# Patient Record
Sex: Female | Born: 1950
Health system: Southern US, Community
[De-identification: ages and names within clinical notes are randomized; demographics above are authoritative.]

## PROBLEM LIST (undated history)

## (undated) DIAGNOSIS — E7800 Pure hypercholesterolemia, unspecified: Secondary | ICD-10-CM

## (undated) DIAGNOSIS — I10 Essential (primary) hypertension: Secondary | ICD-10-CM

## (undated) HISTORY — DX: Essential (primary) hypertension: I10

## (undated) HISTORY — DX: Pure hypercholesterolemia, unspecified: E78.00

---
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
## 2000-07-27 ENCOUNTER — Other Ambulatory Visit: Admission: RE | Admit: 2000-07-27 | Discharge: 2000-07-27 | Payer: Self-pay | Admitting: Gynecology

## 2000-08-06 ENCOUNTER — Encounter: Payer: Self-pay | Admitting: Gynecology

## 2000-08-06 ENCOUNTER — Ambulatory Visit (HOSPITAL_COMMUNITY): Admission: RE | Admit: 2000-08-06 | Discharge: 2000-08-06 | Payer: Self-pay | Admitting: Gynecology

## 2002-08-11 ENCOUNTER — Other Ambulatory Visit: Admission: RE | Admit: 2002-08-11 | Discharge: 2002-08-11 | Payer: Self-pay | Admitting: Gynecology

## 2004-10-16 ENCOUNTER — Ambulatory Visit: Payer: Self-pay

## 2005-07-16 ENCOUNTER — Ambulatory Visit: Payer: Self-pay | Admitting: Family Medicine

## 2005-10-17 ENCOUNTER — Ambulatory Visit: Payer: Self-pay | Admitting: Family Medicine

## 2006-09-14 ENCOUNTER — Ambulatory Visit: Payer: Self-pay | Admitting: Gastroenterology

## 2006-10-21 ENCOUNTER — Ambulatory Visit: Payer: Self-pay | Admitting: Family Medicine

## 2007-10-07 ENCOUNTER — Ambulatory Visit: Payer: Self-pay | Admitting: Family Medicine

## 2008-01-13 DIAGNOSIS — E7800 Pure hypercholesterolemia, unspecified: Secondary | ICD-10-CM

## 2008-01-13 DIAGNOSIS — I10 Essential (primary) hypertension: Secondary | ICD-10-CM

## 2008-05-22 ENCOUNTER — Ambulatory Visit: Payer: Self-pay | Admitting: Family Medicine

## 2008-08-07 DIAGNOSIS — Z86010 Personal history of colonic polyps: Secondary | ICD-10-CM

## 2008-08-15 ENCOUNTER — Ambulatory Visit: Payer: Self-pay | Admitting: Family Medicine

## 2008-12-11 DIAGNOSIS — G4700 Insomnia, unspecified: Secondary | ICD-10-CM

## 2009-04-23 DIAGNOSIS — R739 Hyperglycemia, unspecified: Secondary | ICD-10-CM

## 2009-08-14 ENCOUNTER — Ambulatory Visit: Payer: Self-pay

## 2009-08-14 DIAGNOSIS — E559 Vitamin D deficiency, unspecified: Secondary | ICD-10-CM

## 2011-04-03 ENCOUNTER — Ambulatory Visit: Payer: Self-pay | Admitting: Family Medicine

## 2011-06-26 LAB — HM COLONOSCOPY

## 2011-07-31 ENCOUNTER — Ambulatory Visit: Payer: Self-pay | Admitting: Family Medicine

## 2012-02-05 ENCOUNTER — Ambulatory Visit: Payer: Self-pay | Admitting: Family Medicine

## 2012-05-07 ENCOUNTER — Ambulatory Visit: Payer: Self-pay | Admitting: Family Medicine

## 2014-04-26 ENCOUNTER — Ambulatory Visit: Payer: Self-pay | Admitting: Family Medicine

## 2014-04-26 LAB — HM DEXA SCAN

## 2014-04-26 LAB — HM MAMMOGRAPHY

## 2014-09-28 ENCOUNTER — Other Ambulatory Visit: Payer: Self-pay

## 2014-09-28 DIAGNOSIS — M1711 Unilateral primary osteoarthritis, right knee: Secondary | ICD-10-CM | POA: Insufficient documentation

## 2014-09-28 DIAGNOSIS — R7309 Other abnormal glucose: Secondary | ICD-10-CM | POA: Insufficient documentation

## 2014-09-28 DIAGNOSIS — R7303 Prediabetes: Secondary | ICD-10-CM | POA: Insufficient documentation

## 2014-09-28 DIAGNOSIS — F430 Acute stress reaction: Secondary | ICD-10-CM | POA: Insufficient documentation

## 2014-09-28 DIAGNOSIS — M8580 Other specified disorders of bone density and structure, unspecified site: Secondary | ICD-10-CM | POA: Insufficient documentation

## 2014-09-28 DIAGNOSIS — M25569 Pain in unspecified knee: Secondary | ICD-10-CM | POA: Insufficient documentation

## 2014-09-28 DIAGNOSIS — B999 Unspecified infectious disease: Secondary | ICD-10-CM | POA: Insufficient documentation

## 2014-11-14 ENCOUNTER — Other Ambulatory Visit: Payer: Self-pay | Admitting: Family Medicine

## 2014-12-18 ENCOUNTER — Other Ambulatory Visit: Payer: Self-pay

## 2014-12-18 DIAGNOSIS — I10 Essential (primary) hypertension: Secondary | ICD-10-CM

## 2014-12-18 MED ORDER — METOPROLOL SUCCINATE ER 25 MG PO TB24: 25.0000 mg | ORAL_TABLET | Freq: Every day | ORAL | Status: DC

## 2014-12-18 NOTE — Telephone Encounter (Signed)
Pt contacted office for refill request on the following medications: metoprolol succinate (TOPROL-XL) 25 MG 24 hr tablet to [facility]. Pt stated she has been trying to get this since last week. Thanks TNP

## 2015-01-03 ENCOUNTER — Telehealth: Payer: Self-pay

## 2015-01-03 NOTE — Telephone Encounter (Signed)
Refill request received from [facility] requesting Trazodone HCL 50 mg.

## 2015-01-04 MED ORDER — TRAZODONE HCL 50 MG PO TABS: 25.0000 mg | ORAL_TABLET | Freq: Every evening | ORAL | Status: DC | PRN

## 2015-01-04 NOTE — Telephone Encounter (Signed)
Advised pt as directed. Pt stated that she does not have insurance. She stated that she will call once she gets insurance.  Thanks,

## 2015-01-04 NOTE — Telephone Encounter (Signed)
Need follow up appointment soon. Will send in 1 month refill.

## 2015-03-23 ENCOUNTER — Encounter: Payer: Self-pay | Admitting: Family Medicine

## 2015-04-12 ENCOUNTER — Ambulatory Visit (INDEPENDENT_AMBULATORY_CARE_PROVIDER_SITE_OTHER): Payer: BLUE CROSS/BLUE SHIELD | Admitting: Family Medicine

## 2015-04-12 ENCOUNTER — Encounter: Payer: Self-pay | Admitting: Family Medicine

## 2015-04-12 VITALS — BP 110/80 | HR 64 | Temp 97.8°F | Resp 14 | Ht 60.75 in | Wt 148.6 lb

## 2015-04-12 DIAGNOSIS — G479 Sleep disorder, unspecified: Secondary | ICD-10-CM | POA: Diagnosis not present

## 2015-04-12 DIAGNOSIS — E7800 Pure hypercholesterolemia, unspecified: Secondary | ICD-10-CM

## 2015-04-12 DIAGNOSIS — L309 Dermatitis, unspecified: Secondary | ICD-10-CM

## 2015-04-12 DIAGNOSIS — R7303 Prediabetes: Secondary | ICD-10-CM

## 2015-04-12 DIAGNOSIS — N7689 Other specified inflammation of vagina and vulva: Secondary | ICD-10-CM | POA: Diagnosis not present

## 2015-04-12 DIAGNOSIS — Z0000 Encounter for general adult medical examination without abnormal findings: Secondary | ICD-10-CM | POA: Diagnosis not present

## 2015-04-12 DIAGNOSIS — Z23 Encounter for immunization: Secondary | ICD-10-CM | POA: Diagnosis not present

## 2015-04-12 LAB — POCT URINALYSIS DIPSTICK
BILIRUBIN UA: NEGATIVE
GLUCOSE UA: NEGATIVE
Ketones, UA: NEGATIVE
NITRITE UA: NEGATIVE
Spec Grav, UA: 1.01
UROBILINOGEN UA: 0.2
pH, UA: 7

## 2015-04-12 MED ORDER — TRAZODONE HCL 50 MG PO TABS: 25.0000 mg | ORAL_TABLET | Freq: Every evening | ORAL | Status: DC | PRN

## 2015-04-12 NOTE — Progress Notes (Signed)
Patient ID: Claudia Barnes, female   DOB: June 23, 1950, 65 y.o.   MRN: [id number]       Patient: Claudia Barnes, Female    DOB: 08/21/1950, 65 y.o.   MRN: [id number] Visit Date: 04/12/2015  Today's Provider: Vernie Murders, PA   Chief Complaint  Patient presents with  . Annual Exam   Subjective:    Annual physical exam Claudia Barnes is a 65 y.o. female who presents today for health maintenance and complete physical. She feels fairly well. She reports exercising none outside of the house. She reports she is sleeping poorly (average 3-4 hours per night).  ----------------------------------------------------------------- Mammo: 04/26/2014- Bi-rads Cat. 1 Colonoscopy: 06/26/2011- internal hemorroids Tdap: 08/14/2009 BMD:04/26/2014- osteopenia Pap: 03/20/2014  Review of Systems  Constitutional: Negative.        Crying, irritability   HENT: Negative.   Eyes: Negative.   Respiratory: Negative.   Cardiovascular: Negative.   Gastrointestinal: Positive for constipation.  Endocrine: Negative.   Genitourinary: Negative.        Vaginal itch   Musculoskeletal: Positive for arthralgias.  Skin: Negative.   Allergic/Immunologic: Negative.   Neurological: Negative.   Hematological: Negative.   Psychiatric/Behavioral: Negative.           Social History   Social History  . Marital Status: Divorced    Spouse Name: N/A  . Number of Children: N/A  . Years of Education: N/A   Social History Main Topics  . Smoking status: None  . Smokeless tobacco: None  . Alcohol Use: None  . Drug Use: None  . Sexual Activity: Not Asked   Other Topics Concern  . None   Social History Narrative    Patient Active Problem List   Diagnosis Date Noted  . Acute stress disorder 09/28/2014  . Contagious disease 09/28/2014  . Elevated glycated hemoglobin 09/28/2014  . Arthritis of knee, degenerative 09/28/2014  . Osteopenia 09/28/2014  . Pain in joint, lower leg  09/28/2014  . Borderline diabetes 09/28/2014  . Avitaminosis D 08/14/2009  . Abnormal blood sugar 04/23/2009  . Cannot sleep 12/11/2008  . History of colon polyps 08/07/2008  . Benign essential HTN 01/13/2008  . Hypercholesterolemia without hypertriglyceridemia 01/13/2008    Past Surgical History  Procedure Laterality Date  . Cesarean section  1981  . Appendectomy  1970    Family History        Family Status  Relation Status Death Age  . Father Deceased 52  . Sister Alive   . Sister Alive         Her family history includes COPD in her mother; Congestive Heart Failure in her father; Depression in her mother; Hyperlipidemia in her sister and sister; Hypertension in her sister and sister.    Allergies  Allergen Reactions  . Codeine     Previous Medications   CHOLECALCIFEROL 1000 UNITS CAPSULE    Take 2 capsules by mouth daily.   LOVASTATIN (MEVACOR) 40 MG TABLET    TAKE ONE TABLET AT BEDTIME   METOPROLOL SUCCINATE (TOPROL-XL) 25 MG 24 HR TABLET    Take 1 tablet (25 mg total) by mouth daily.   SENNA (SENOKOT) 8.6 MG TABLET    Take 1 tablet by mouth daily.   TRAZODONE (DESYREL) 50 MG TABLET    Take 0.5-1 tablets (25-50 mg total) by mouth at bedtime as needed for sleep.    Patient Care Team: Margo Common, PA as PCP - General (Physician Assistant)     Objective:  Vitals: BP 110/80 mmHg  Pulse 64  Temp(Src) 97.8 F (36.6 C) (Oral)  Resp 14  Ht 5' 0.75" (1.543 m)  Wt 148 lb 9.6 oz (67.405 kg)  BMI 28.31 kg/m2   Physical Exam  Constitutional: She is oriented to person, place, and time. She appears well-developed and well-nourished.  HENT:  Head: Normocephalic and atraumatic.  Right Ear: External ear normal.  Left Ear: External ear normal.  Nose: Nose normal.  Mouth/Throat: Oropharynx is clear and moist.  Eyes: Conjunctivae and EOM are normal. Pupils are equal, round, and reactive to light. Right eye exhibits no discharge.  Neck: Normal range of motion. Neck  supple. No tracheal deviation present. No thyromegaly present.  Cardiovascular: Normal rate, regular rhythm, normal heart sounds and intact distal pulses.   No murmur heard. Pulmonary/Chest: Effort normal and breath sounds normal. No respiratory distress. She has no wheezes. She has no rales. She exhibits no tenderness.  Abdominal: Soft. She exhibits no distension and no mass. There is no tenderness. There is no rebound and no guarding.  Genitourinary: Vagina normal.  Vulvar pruritic rash with slight white scale and surrounding erythema. No pain or ulcerations. No vaginal discharge.  Musculoskeletal: Normal range of motion. She exhibits no edema.  Some crepitus of the knees (R>L).  Lymphadenopathy:    She has no cervical adenopathy.  Neurological: She is alert and oriented to person, place, and time. She has normal reflexes. No cranial nerve deficit. She exhibits normal muscle tone. Coordination normal.  Skin: Skin is warm and dry. Rash noted.  Pruritic scaly rash in posterior occipital hairline with some thick silvery scale.  Psychiatric: She has a normal mood and affect. Her behavior is normal. Judgment and thought content normal.     Depression Screen Feeling stress with son and two grandchildren living with her every weekend. Some crying spells and not sleeping well. Has not taken Trazodone routinely. It usually helps.    Assessment & Plan:     Routine Health Maintenance and Physical Exam  Exercise Activities and Dietary recommendations Goals    Continue balanced diet and trying to exercise as arthritis of knees allows.      Immunization History  Administered Date(s) Administered  . Tdap 08/14/2009    Health Maintenance  Topic Date Due  . Hepatitis C Screening  Jan 30, 1951  . HIV Screening  08/28/1967  . PAP SMEAR  08/27/1973  . ZOSTAVAX  08/27/2012  . INFLUENZA VACCINE  10/30/2014  . MAMMOGRAM  04/26/2016  . TETANUS/TDAP  08/15/2019  . COLONOSCOPY  06/25/2021       Discussed health benefits of physical activity, and encouraged her to engage in regular exercise appropriate for her age and condition.    -------------------------------------------------------------------- 1. Annual physical exam General health stable. Having arthritis issues in knees (had Synvisc injection in the right knee by Dr. Marry Guan in March 2015). Will up date immunizations today. Will get urinalysis and routine labs. - POCT urinalysis dipstick  2. Need for influenza vaccination - Flu Vaccine QUAD 36+ mos PF IM (Fluarix & Fluzone Quad PF)  3. Borderline diabetes Last Hgb A1C on 08-16-13 was 5.8. Will recheck labs and encouraged to restrict sugar intake and follow low fat diet. May need Metformin if Hgb A1C is higher. - CBC with Differential/Platelet - Hemoglobin A1c  4. Hypercholesterolemia without hypertriglyceridemia Tolerating the Lovastatin 40 mg qd. Encouraged to follow a low fat diet and continue the Lovastatin regularly. Recheck labs and follow up pending reports. - COMPLETE METABOLIC PANEL  WITH GFR - Lipid panel - TSH  5. Sleep disorder Associated with stress disorder due to family issues. Recommend she restart the Trazodone and recheck in 3 months. - traZODone (DESYREL) 50 MG tablet; Take 0.5-1 tablets (25-50 mg total) by mouth at bedtime as needed for sleep.  Dispense: 30 tablet; Refill: 6  6. Vulvar dermatitis Suspect psoriasis with patch on scalp posterior occipital hairline. May use steroid prescribed by dermatologist. Recommend follow up with dermatologist soon.  7. Need for Zostavax administration - Varicella-zoster vaccine subcutaneous

## 2015-04-13 LAB — CBC WITH DIFFERENTIAL/PLATELET
Basophils Absolute: 0 10*3/uL (ref 0.0–0.2)
Basos: 0 %
EOS (ABSOLUTE): 0.1 10*3/uL (ref 0.0–0.4)
EOS: 1 %
HEMATOCRIT: 40.2 % (ref 34.0–46.6)
HEMOGLOBIN: 14 g/dL (ref 11.1–15.9)
IMMATURE GRANS (ABS): 0 10*3/uL (ref 0.0–0.1)
Immature Granulocytes: 0 %
LYMPHS ABS: 1.7 10*3/uL (ref 0.7–3.1)
LYMPHS: 29 %
MCH: 28.7 pg (ref 26.6–33.0)
MCHC: 34.8 g/dL (ref 31.5–35.7)
MCV: 83 fL (ref 79–97)
MONOCYTES: 6 %
Monocytes Absolute: 0.4 10*3/uL (ref 0.1–0.9)
NEUTROS ABS: 3.9 10*3/uL (ref 1.4–7.0)
Neutrophils: 64 %
Platelets: 244 10*3/uL (ref 150–379)
RBC: 4.87 x10E6/uL (ref 3.77–5.28)
RDW: 13.8 % (ref 12.3–15.4)
WBC: 6.1 10*3/uL (ref 3.4–10.8)

## 2015-04-13 LAB — COMPREHENSIVE METABOLIC PANEL
ALK PHOS: 84 IU/L (ref 39–117)
ALT: 20 IU/L (ref 0–32)
AST: 26 IU/L (ref 0–40)
Albumin/Globulin Ratio: 2.2 (ref 1.1–2.5)
Albumin: 4.8 g/dL (ref 3.6–4.8)
BILIRUBIN TOTAL: 0.4 mg/dL (ref 0.0–1.2)
BUN/Creatinine Ratio: 12 (ref 11–26)
BUN: 9 mg/dL (ref 8–27)
CHLORIDE: 97 mmol/L (ref 96–106)
CO2: 26 mmol/L (ref 18–29)
CREATININE: 0.73 mg/dL (ref 0.57–1.00)
Calcium: 9.8 mg/dL (ref 8.7–10.3)
GFR calc Af Amer: 102 mL/min/{1.73_m2} (ref >59)
GFR calc non Af Amer: 89 mL/min/{1.73_m2} (ref >59)
GLOBULIN, TOTAL: 2.2 g/dL (ref 1.5–4.5)
Glucose: 101 mg/dL — ABNORMAL HIGH (ref 65–99)
Potassium: 4.7 mmol/L (ref 3.5–5.2)
Sodium: 140 mmol/L (ref 134–144)
Total Protein: 7 g/dL (ref 6.0–8.5)

## 2015-04-13 LAB — LIPID PANEL
CHOL/HDL RATIO: 3.5 ratio (ref 0.0–4.4)
CHOLESTEROL TOTAL: 195 mg/dL (ref 100–199)
HDL: 55 mg/dL (ref >39)
LDL CALC: 121 mg/dL — AB (ref 0–99)
TRIGLYCERIDES: 97 mg/dL (ref 0–149)
VLDL CHOLESTEROL CAL: 19 mg/dL (ref 5–40)

## 2015-04-13 LAB — HEMOGLOBIN A1C
Est. average glucose Bld gHb Est-mCnc: 120 mg/dL
Hgb A1c MFr Bld: 5.8 % — ABNORMAL HIGH (ref 4.8–5.6)

## 2015-04-13 LAB — TSH: TSH: 1.18 u[IU]/mL (ref 0.450–4.500)

## 2015-04-14 ENCOUNTER — Telehealth: Payer: Self-pay

## 2015-04-14 NOTE — Telephone Encounter (Signed)
Patient advised as directed below.  Thanks,  -Joseline 

## 2015-04-14 NOTE — Telephone Encounter (Signed)
-----   Message from [place], [place] sent at 04/13/2015  5:52 PM EST ----- Blood tests essentially normal except Hgb A1C borderline prediabetic level and LDL cholesterol still a little elevated. Very good HDL and triglyceride levels. Continue low fat diet, lower sugar intake and proceed with present dosages of medications. Recheck in 4 months.

## 2015-05-09 ENCOUNTER — Other Ambulatory Visit: Payer: Self-pay | Admitting: Family Medicine

## 2015-06-08 ENCOUNTER — Other Ambulatory Visit: Payer: Self-pay | Admitting: Family Medicine

## 2015-07-20 ENCOUNTER — Encounter: Payer: Self-pay | Admitting: Family Medicine

## 2015-07-20 ENCOUNTER — Ambulatory Visit (INDEPENDENT_AMBULATORY_CARE_PROVIDER_SITE_OTHER): Payer: BLUE CROSS/BLUE SHIELD | Admitting: Family Medicine

## 2015-07-20 VITALS — BP 108/70 | HR 69 | Temp 98.0°F | Resp 16 | Wt 145.2 lb

## 2015-07-20 DIAGNOSIS — L409 Psoriasis, unspecified: Secondary | ICD-10-CM | POA: Diagnosis not present

## 2015-07-20 DIAGNOSIS — R300 Dysuria: Secondary | ICD-10-CM

## 2015-07-20 LAB — POCT URINALYSIS DIPSTICK
Bilirubin, UA: NEGATIVE
GLUCOSE UA: NEGATIVE
Ketones, UA: NEGATIVE
NITRITE UA: NEGATIVE
Protein, UA: NEGATIVE
Spec Grav, UA: 1.01
UROBILINOGEN UA: 0.2
pH, UA: 7

## 2015-07-20 MED ORDER — NITROFURANTOIN MONOHYD MACRO 100 MG PO CAPS: 100.0000 mg | ORAL_CAPSULE | Freq: Two times a day (BID) | ORAL | Status: DC

## 2015-07-20 NOTE — Progress Notes (Signed)
Patient ID: Claudia Barnes, female   DOB: 01/18/1951, 65 y.o.   MRN: [id number]   Patient: Claudia Barnes Female    DOB: 04/22/1950   65 y.o.   MRN: [id number] Visit Date: 07/20/2015  Today's Provider: Vernie Murders, PA   Chief Complaint  Patient presents with  . Dysuria   Subjective:    Dysuria  This is a new problem. The current episode started yesterday. The problem occurs every urination. The problem has been unchanged. The quality of the pain is described as burning. The pain is at a severity of 5/10. Associated symptoms include frequency and urgency. Associated symptoms comments: incontinence . She has tried increased fluids for the symptoms. The treatment provided no relief.   Patient Active Problem List   Diagnosis Date Noted  . Acute stress disorder 09/28/2014  . Contagious disease 09/28/2014  . Elevated glycated hemoglobin 09/28/2014  . Arthritis of knee, degenerative 09/28/2014  . Osteopenia 09/28/2014  . Pain in joint, lower leg 09/28/2014  . Borderline diabetes 09/28/2014  . Avitaminosis D 08/14/2009  . Abnormal blood sugar 04/23/2009  . Cannot sleep 12/11/2008  . History of colon polyps 08/07/2008  . Benign essential HTN 01/13/2008  . Hypercholesterolemia without hypertriglyceridemia 01/13/2008   History reviewed. No pertinent past medical history.   Past Surgical History  Procedure Laterality Date  . Cesarean section  1981  . Appendectomy  1970   Family History  Problem Relation Age of Onset  . COPD Mother   . Depression Mother   . Congestive Heart Failure Father   . Hyperlipidemia Sister   . Hypertension Sister   . Hyperlipidemia Sister   . Hypertension Sister      Previous Medications   CHOLECALCIFEROL 1000 UNITS CAPSULE    Take 2 capsules by mouth daily.   LOVASTATIN (MEVACOR) 40 MG TABLET    TAKE ONE (1) TABLET AT BEDTIME   METOPROLOL SUCCINATE (TOPROL-XL) 25 MG 24 HR TABLET    TAKE ONE (1) TABLET EACH DAY   MULTIPLE  VITAMIN (MULTIVITAMIN) TABLET    Take 1 tablet by mouth daily.   SENNA (SENOKOT) 8.6 MG TABLET    Take 1 tablet by mouth daily.   TRAZODONE (DESYREL) 50 MG TABLET    Take 0.5-1 tablets (25-50 mg total) by mouth at bedtime as needed for sleep.   Allergies  Allergen Reactions  . Codeine     Review of Systems  Constitutional: Negative.   HENT: Negative.   Eyes: Negative.   Respiratory: Negative.   Cardiovascular: Negative.   Gastrointestinal: Negative.   Endocrine: Negative.   Genitourinary: Positive for dysuria, urgency and frequency.  Musculoskeletal: Negative.   Skin: Negative.   Allergic/Immunologic: Negative.   Neurological: Negative.   Hematological: Negative.   Psychiatric/Behavioral: Negative.     Social History  Substance Use Topics  . Smoking status: Never Smoker   . Smokeless tobacco: Never Used  . Alcohol Use: 0.0 oz/week    0 Standard drinks or equivalent per week     Comment: occasionally drinks wine   Objective:   BP 108/70 mmHg  Pulse 69  Temp(Src) 98 F (36.7 C) (Oral)  Resp 16  Wt 145 lb 3.2 oz (65.862 kg)  Physical Exam  Constitutional: She is oriented to person, place, and time. She appears well-developed and well-nourished. No distress.  HENT:  Head: Normocephalic and atraumatic.  Right Ear: Hearing normal.  Left Ear: Hearing normal.  Nose: Nose normal.  Eyes: Conjunctivae and lids are normal. Right  eye exhibits no discharge. Left eye exhibits no discharge. No scleral icterus.  Neck: Neck supple.  Cardiovascular: Normal rate and regular rhythm.   Pulmonary/Chest: Effort normal and breath sounds normal. No respiratory distress.  Abdominal: Soft. Bowel sounds are normal.  Musculoskeletal: Normal range of motion.  Neurological: She is alert and oriented to person, place, and time.  Skin: Skin is intact. Rash noted. No lesion noted.  Dry silvery scale on rash in occipital hairline. Pruritic.  Psychiatric: She has a normal mood and affect. Her  speech is normal and behavior is normal. Thought content normal.      Assessment & Plan:     1. Dysuria Onset yesterday with burning while urinating. No hematuria. Urinalysis showed 1+ bacteria and 8-10 WBC's per hpf. Will start Macrobid and may use AZO-Standard for discomfort. Increase fluid intake and get C&S. Recheck pending culture report. - POCT urinalysis dipstick - Urine culture - nitrofurantoin, macrocrystal-monohydrate, (MACROBID) 100 MG capsule; Take 1 capsule (100 mg total) by mouth 2 (two) times daily.  Dispense: 14 capsule; Refill: 0  2. Psoriasis Flare in occipital hair line. May use Nizoral or Selsun shampoo with Hydrocortisone or Benadryl prn itching. Recheck prn.

## 2015-07-21 LAB — URINE CULTURE: Organism ID, Bacteria: NO GROWTH

## 2015-07-23 ENCOUNTER — Telehealth: Payer: Self-pay

## 2015-07-23 NOTE — Telephone Encounter (Signed)
-----   Message from Margo Common, [place] sent at 07/23/2015  2:05 AM EDT ----- No significant bacterial growth on culture. Finish the [employer]. If symptoms persist, may need referral to urologist.

## 2015-07-23 NOTE — Telephone Encounter (Signed)
Patient advised as directed below. Patient verbalized understanding.  

## 2015-08-08 ENCOUNTER — Other Ambulatory Visit: Payer: Self-pay | Admitting: Family Medicine

## 2015-08-20 ENCOUNTER — Ambulatory Visit (INDEPENDENT_AMBULATORY_CARE_PROVIDER_SITE_OTHER): Payer: BLUE CROSS/BLUE SHIELD | Admitting: Family Medicine

## 2015-08-20 ENCOUNTER — Encounter: Payer: Self-pay | Admitting: Family Medicine

## 2015-08-20 VITALS — BP 114/66 | HR 66 | Temp 98.0°F | Resp 16 | Wt 148.0 lb

## 2015-08-20 DIAGNOSIS — I10 Essential (primary) hypertension: Secondary | ICD-10-CM | POA: Diagnosis not present

## 2015-08-20 DIAGNOSIS — E7800 Pure hypercholesterolemia, unspecified: Secondary | ICD-10-CM | POA: Diagnosis not present

## 2015-08-20 DIAGNOSIS — G4700 Insomnia, unspecified: Secondary | ICD-10-CM | POA: Diagnosis not present

## 2015-08-20 DIAGNOSIS — R7303 Prediabetes: Secondary | ICD-10-CM | POA: Diagnosis not present

## 2015-08-20 LAB — POCT GLYCOSYLATED HEMOGLOBIN (HGB A1C): Hemoglobin A1C: 5.6

## 2015-08-20 NOTE — Progress Notes (Signed)
Patient ID: Claudia Barnes, female   DOB: 1950-12-16, 65 y.o.   MRN: [id number]       Patient: Claudia Barnes Female    DOB: 12/25/1950   65 y.o.   MRN: [id number] Visit Date: 08/20/2015  Today's Provider: Vernie Murders, PA   Chief Complaint  Patient presents with  . Diabetes  . Hyperlipidemia  . Insomnia  . Hypertension   Subjective:    HPI  Patient is here for 4 months follow up.  Diabetes: Patient does not check her sugars at home, she is not on any medications for this. Lab Results  Component Value Date   HGBA1C 5.8* 04/12/2015   Hyperlipidemia: She is taking Lovastatin daily. Tolerating it well. Lab Results  Component Value Date   CHOL 195 04/12/2015   HDL 55 04/12/2015   LDLCALC 121* 04/12/2015   TRIG 97 04/12/2015   CHOLHDL 3.5 04/12/2015   Insomnia: patient was re started on Trazodone in January. Patient states it has helped her some. She has hard time staying asleep.hear noises in the "old" home at night.  Hypertension: patient does not check her b/p at home. No cardiac symptoms. BP Readings from Last 3 Encounters:  08/20/15 114/66  07/20/15 108/70  04/12/15 110/80   No past medical history on file. Patient Active Problem List   Diagnosis Date Noted  . Acute stress disorder 09/28/2014  . Contagious disease 09/28/2014  . Elevated glycated hemoglobin 09/28/2014  . Arthritis of knee, degenerative 09/28/2014  . Osteopenia 09/28/2014  . Pain in joint, lower leg 09/28/2014  . Borderline diabetes 09/28/2014  . Avitaminosis D 08/14/2009  . Abnormal blood sugar 04/23/2009  . Cannot sleep 12/11/2008  . History of colon polyps 08/07/2008  . Benign essential HTN 01/13/2008  . Hypercholesterolemia without hypertriglyceridemia 01/13/2008   Past Surgical History  Procedure Laterality Date  . Cesarean section  1981  . Appendectomy  1970   Family History  Problem Relation Age of Onset  . COPD Mother   . Depression Mother   .  Congestive Heart Failure Father   . Hyperlipidemia Sister   . Hypertension Sister   . Hyperlipidemia Sister   . Hypertension Sister    Allergies  Allergen Reactions  . Codeine    Previous Medications   ASCORBIC ACID (VITAMIN C) 1000 MG TABLET    Take 1,000 mg by mouth daily.   CHOLECALCIFEROL 1000 UNITS CAPSULE    Take 2 capsules by mouth daily.   LOVASTATIN (MEVACOR) 40 MG TABLET    TAKE ONE (1) TABLET AT BEDTIME   METOPROLOL SUCCINATE (TOPROL-XL) 25 MG 24 HR TABLET    TAKE ONE (1) TABLET EACH DAY   MULTIPLE VITAMIN (MULTIVITAMIN) TABLET    Take 1 tablet by mouth daily.   SENNA (SENOKOT) 8.6 MG TABLET    Take 1 tablet by mouth daily.   TRAZODONE (DESYREL) 50 MG TABLET    Take 0.5-1 tablets (25-50 mg total) by mouth at bedtime as needed for sleep.    Review of Systems  Constitutional: Negative.   Respiratory: Negative.   Cardiovascular: Negative.   Gastrointestinal: Negative.   Musculoskeletal: Positive for arthralgias and gait problem.    Social History  Substance Use Topics  . Smoking status: Never Smoker   . Smokeless tobacco: Never Used  . Alcohol Use: 0.0 oz/week    0 Standard drinks or equivalent per week     Comment: occasionally drinks wine   Objective:   BP 114/66 mmHg  Pulse 66  Temp(Src) 98 F (36.7 C)  Resp 16  Wt 148 lb (67.132 kg) Wt Readings from Last 3 Encounters:  08/20/15 148 lb (67.132 kg)  07/20/15 145 lb 3.2 oz (65.862 kg)  04/12/15 148 lb 9.6 oz (67.405 kg)    Physical Exam  Constitutional: She is oriented to person, place, and time. She appears well-developed and well-nourished. No distress.  HENT:  Head: Normocephalic and atraumatic.  Right Ear: Hearing and external ear normal.  Left Ear: Hearing and external ear normal.  Nose: Nose normal.  Eyes: Conjunctivae and lids are normal. Right eye exhibits no discharge. Left eye exhibits no discharge. No scleral icterus.  Neck: Neck supple.  Cardiovascular: Normal rate and regular rhythm.     Pulmonary/Chest: Effort normal and breath sounds normal. No respiratory distress.  Abdominal: Soft. Bowel sounds are normal.  Musculoskeletal: Normal range of motion.  Crepitus both knees and uses can for support.  Neurological: She is alert and oriented to person, place, and time.  Skin: Skin is intact. No lesion and no rash noted.  Psychiatric: She has a normal mood and affect. Her speech is normal and behavior is normal. Thought content normal.      Assessment & Plan:     1. Borderline diabetes Very good control with diet restrictions. Hgb A1C 5.6 today. Encouraged to proceed with water aerobics to protect arthritis in knees. Recheck in 3-4 months. - POCT HgB A1C  2. Hypercholesterolemia without hypertriglyceridemia Stable and tolerating Lovastatin 40 mg qd without side effects. Continue low fat diet.  3. Cannot sleep Beginning to sleep better now that she has restarted the Trazodone. Depressive symptoms still present. No suicidal ideation. Living alone and worried about her family. Encouraged to increase dosage to a whole 50 mg tablet every night. Recheck in 3-4 months.  4. Benign essential HTN Stable and well controlled. Tolerating Metoprolol succinate 25 mg qd without palpitations, chest pain, edema or dyspnea. Continue present dosage and follow up in 3-4 months.       Vernie Murders, PA  [facility]

## 2015-12-14 ENCOUNTER — Encounter: Payer: Self-pay | Admitting: Family Medicine

## 2015-12-14 ENCOUNTER — Ambulatory Visit (INDEPENDENT_AMBULATORY_CARE_PROVIDER_SITE_OTHER): Payer: BLUE CROSS/BLUE SHIELD | Admitting: Family Medicine

## 2015-12-14 VITALS — BP 108/56 | HR 82 | Temp 97.5°F | Resp 16 | Wt 147.4 lb

## 2015-12-14 DIAGNOSIS — Z23 Encounter for immunization: Secondary | ICD-10-CM

## 2015-12-14 DIAGNOSIS — F439 Reaction to severe stress, unspecified: Secondary | ICD-10-CM

## 2015-12-14 DIAGNOSIS — R002 Palpitations: Secondary | ICD-10-CM | POA: Diagnosis not present

## 2015-12-14 DIAGNOSIS — M79602 Pain in left arm: Secondary | ICD-10-CM

## 2015-12-14 DIAGNOSIS — F430 Acute stress reaction: Secondary | ICD-10-CM

## 2015-12-14 DIAGNOSIS — M79622 Pain in left upper arm: Secondary | ICD-10-CM

## 2015-12-14 NOTE — Progress Notes (Signed)
Patient: Claudia Barnes Female    DOB: 1950-04-06   65 y.o.   MRN: [id number] Visit Date: 12/14/2015  Today's Provider: Vernie Murders, PA   Chief Complaint  Patient presents with  . Arm Pain   Subjective:    Arm Pain   The incident occurred 12 to 24 hours ago. The incident occurred at home. Injury mechanism: reaching in cabinet. The pain is present in the left shoulder. The quality of the pain is described as aching. The pain does not radiate. The patient is experiencing no pain. The pain has been improving since the incident.   No past medical history on file. Patient Active Problem List   Diagnosis Date Noted  . Acute stress disorder 09/28/2014  . Contagious disease 09/28/2014  . Elevated glycated hemoglobin 09/28/2014  . Arthritis of knee, degenerative 09/28/2014  . Osteopenia 09/28/2014  . Pain in joint, lower leg 09/28/2014  . Borderline diabetes 09/28/2014  . Avitaminosis D 08/14/2009  . Abnormal blood sugar 04/23/2009  . Cannot sleep 12/11/2008  . History of colon polyps 08/07/2008  . Benign essential HTN 01/13/2008  . Hypercholesterolemia without hypertriglyceridemia 01/13/2008   Past Surgical History:  Procedure Laterality Date  . APPENDECTOMY  1970  . CESAREAN SECTION  1981   Family History  Problem Relation Age of Onset  . COPD Mother   . Depression Mother   . Congestive Heart Failure Father   . Hyperlipidemia Sister   . Hypertension Sister   . Hyperlipidemia Sister   . Hypertension Sister    Allergies  Allergen Reactions  . Codeine    Previous Medications   ASCORBIC ACID (VITAMIN C) 1000 MG TABLET    Take 1,000 mg by mouth daily.   CHOLECALCIFEROL 1000 UNITS CAPSULE    Take 2 capsules by mouth daily.   LOVASTATIN (MEVACOR) 40 MG TABLET    TAKE ONE (1) TABLET AT BEDTIME   METOPROLOL SUCCINATE (TOPROL-XL) 25 MG 24 HR TABLET    TAKE ONE (1) TABLET EACH DAY   MULTIPLE VITAMIN (MULTIVITAMIN) TABLET    Take 1 tablet by mouth daily.   SENNA  (SENOKOT) 8.6 MG TABLET    Take 1 tablet by mouth daily.   TRAZODONE (DESYREL) 50 MG TABLET    Take 0.5-1 tablets (25-50 mg total) by mouth at bedtime as needed for sleep.   Review of Systems  Constitutional: Negative.   Respiratory: Negative.   Cardiovascular: Negative.   Musculoskeletal: Positive for myalgias.   Social History  Substance Use Topics  . Smoking status: Never Smoker  . Smokeless tobacco: Never Used  . Alcohol use 0.0 oz/week     Comment: occasionally drinks wine   Objective:   BP (!) 108/56 (BP Location: Right Arm, Patient Position: Sitting, Cuff Size: Normal)   Pulse 82   Temp 97.5 F (36.4 C) (Oral)   Resp 16   Wt 147 lb 6.4 oz (66.9 kg)   BMI 28.08 kg/m  BP Readings from Last 3 Encounters:  12/14/15 (!) 108/56  08/20/15 114/66  07/20/15 108/70    Physical Exam  Constitutional: She is oriented to person, place, and time. She appears well-developed and well-nourished. No distress.  HENT:  Head: Normocephalic and atraumatic.  Right Ear: Hearing normal.  Left Ear: Hearing normal.  Nose: Nose normal.  Eyes: Conjunctivae and lids are normal. Right eye exhibits no discharge. Left eye exhibits no discharge. No scleral icterus.  Neck: Neck supple.  Cardiovascular: Normal rate, regular rhythm and normal heart sounds.  Pulmonary/Chest: Effort normal and breath sounds normal. No respiratory distress.  Abdominal: Soft. Bowel sounds are normal.  Musculoskeletal: Normal range of motion.  Minimal soreness in left axillary muscles.  Neurological: She is alert and oriented to person, place, and time.  Skin: Skin is intact. No lesion and no rash noted.  Psychiatric: She has a normal mood and affect. Her speech is normal and behavior is normal. Thought content normal.      Assessment & Plan:     1. Left axillary pain Onset over the past 12-24 hours after trying to reach for something very high overhead. Suspect muscle strain in axilla. No pain or tenderness today.  May use moist heat and recheck prn.  2. Palpitations Denies chest pain or palpitations today. EKG normal with minor changes. Suspected secondary to anxiety and stress reaction from family stress. Proceed with anxiety/depression/insomnia treatment with the Trazodone. - EKG 12-Lead  3. Acute stress disorder Family conflicts and had to make 68 year old son move out of her home. Not sleeping well and some spontaneous crying spells. Has not started the Trazodone to try to help sleep and these symptoms. Will go ahead and take the Trazodone 25-50 mg hs. Recheck in a month to assess progress.  4. Need for influenza vaccination - Flu Vaccine QUAD 36+ mos PF IM (Fluarix & Fluzone Quad PF)

## 2016-01-02 ENCOUNTER — Other Ambulatory Visit: Payer: Self-pay | Admitting: Family Medicine

## 2016-04-02 ENCOUNTER — Other Ambulatory Visit: Payer: Self-pay | Admitting: Family Medicine

## 2016-04-14 ENCOUNTER — Ambulatory Visit (INDEPENDENT_AMBULATORY_CARE_PROVIDER_SITE_OTHER): Payer: BLUE CROSS/BLUE SHIELD | Admitting: Family Medicine

## 2016-04-14 ENCOUNTER — Encounter: Payer: Self-pay | Admitting: Family Medicine

## 2016-04-14 VITALS — BP 122/76 | HR 63 | Temp 98.0°F | Resp 14 | Ht 60.75 in | Wt 147.2 lb

## 2016-04-14 DIAGNOSIS — E7800 Pure hypercholesterolemia, unspecified: Secondary | ICD-10-CM | POA: Diagnosis not present

## 2016-04-14 DIAGNOSIS — Z114 Encounter for screening for human immunodeficiency virus [HIV]: Secondary | ICD-10-CM

## 2016-04-14 DIAGNOSIS — Z1231 Encounter for screening mammogram for malignant neoplasm of breast: Secondary | ICD-10-CM | POA: Diagnosis not present

## 2016-04-14 DIAGNOSIS — L409 Psoriasis, unspecified: Secondary | ICD-10-CM | POA: Insufficient documentation

## 2016-04-14 DIAGNOSIS — Z0000 Encounter for general adult medical examination without abnormal findings: Secondary | ICD-10-CM

## 2016-04-14 DIAGNOSIS — M170 Bilateral primary osteoarthritis of knee: Secondary | ICD-10-CM

## 2016-04-14 DIAGNOSIS — I10 Essential (primary) hypertension: Secondary | ICD-10-CM | POA: Diagnosis not present

## 2016-04-14 DIAGNOSIS — Z1239 Encounter for other screening for malignant neoplasm of breast: Secondary | ICD-10-CM

## 2016-04-14 DIAGNOSIS — M8589 Other specified disorders of bone density and structure, multiple sites: Secondary | ICD-10-CM | POA: Diagnosis not present

## 2016-04-14 DIAGNOSIS — Z1159 Encounter for screening for other viral diseases: Secondary | ICD-10-CM | POA: Diagnosis not present

## 2016-04-14 NOTE — Progress Notes (Signed)
Patient: Claudia Barnes, Female    DOB: Aug 09, 1950, 66 y.o.   MRN: [id number] Visit Date: 04/14/2016  Today's Provider: Vernie Murders, PA   Chief Complaint  Patient presents with  . Annual Exam   Subjective:    Annual physical exam Claudia Barnes is a 66 y.o. female who presents today for health maintenance and complete physical. She feels well. She reports exercising none. She reports she is sleeping poorly.  -----------------------------------------------------------------   Review of Systems  Constitutional: Positive for chills.  HENT: Negative.   Eyes: Negative.   Respiratory: Negative.   Cardiovascular: Negative.   Gastrointestinal: Positive for constipation.  Endocrine: Negative.   Genitourinary: Negative.   Musculoskeletal: Positive for arthralgias.  Skin: Negative.   Allergic/Immunologic: Negative.   Neurological: Negative.   Hematological: Negative.   Psychiatric/Behavioral: Positive for sleep disturbance.    Social History      She  reports that she has never smoked. She has never used smokeless tobacco. She reports that she drinks alcohol. She reports that she does not use drugs.       Social History   Social History  . Marital status: Divorced    Spouse name: N/A  . Number of children: N/A  . Years of education: N/A   Social History Main Topics  . Smoking status: Never Smoker  . Smokeless tobacco: Never Used  . Alcohol use 0.0 oz/week     Comment: occasionally drinks wine  . Drug use: No  . Sexual activity: Not Asked   Other Topics Concern  . None   Social History Narrative  . None    No past medical history on file.   Patient Active Problem List   Diagnosis Date Noted  . Acute stress disorder 09/28/2014  . Contagious disease 09/28/2014  . Elevated glycated hemoglobin 09/28/2014  . Arthritis of knee, degenerative 09/28/2014  . Osteopenia 09/28/2014  . Pain in joint, lower leg 09/28/2014  .  Borderline diabetes 09/28/2014  . Avitaminosis D 08/14/2009  . Abnormal blood sugar 04/23/2009  . Cannot sleep 12/11/2008  . History of colon polyps 08/07/2008  . Benign essential HTN 01/13/2008  . Hypercholesterolemia without hypertriglyceridemia 01/13/2008    Past Surgical History:  Procedure Laterality Date  . APPENDECTOMY  1970  . CESAREAN SECTION  1981    Family History        Family Status  Relation Status  . Father Deceased at age 38  . Sister Alive  . Sister Alive        Her family history includes COPD in her mother; Congestive Heart Failure in her father; Depression in her mother; Hyperlipidemia in her sister and sister; Hypertension in her sister and sister.     Allergies  Allergen Reactions  . Codeine      Current Outpatient Prescriptions:  .  Ascorbic Acid (VITAMIN C) 1000 MG tablet, Take 1,000 mg by mouth daily., Disp: , Rfl:  .  CALCIUM PO, Take by mouth., Disp: , Rfl:  .  Cholecalciferol 1000 UNITS capsule, Take 2 capsules by mouth daily., Disp: , Rfl:  .  lovastatin (MEVACOR) 40 MG tablet, TAKE ONE TABLET AT BEDTIME, Disp: 30 tablet, Rfl: 3 .  metoprolol succinate (TOPROL-XL) 25 MG 24 hr tablet, TAKE ONE (1) TABLET EACH DAY, Disp: 30 tablet, Rfl: 12 .  Multiple Vitamin (MULTIVITAMIN) tablet, Take 1 tablet by mouth daily., Disp: , Rfl:  .  senna (SENOKOT) 8.6 MG tablet, Take 1 tablet by mouth  daily., Disp: , Rfl:  .  traZODone (DESYREL) 50 MG tablet, Take 0.5-1 tablets (25-50 mg total) by mouth at bedtime as needed for sleep., Disp: 30 tablet, Rfl: 6   Patient Care Team: Margo Common, PA as PCP - General (Physician Assistant)      Objective:   Vitals: BP 122/76 (BP Location: Right Arm, Patient Position: Sitting, Cuff Size: Normal)   Pulse 63   Temp 98 F (36.7 C) (Oral)   Resp 14   Ht 5' 0.75" (1.543 m)   Wt 147 lb 3.2 oz (66.8 kg)   SpO2 99%   BMI 28.04 kg/m    Physical Exam  Constitutional: She is oriented to person, place, and time.  She appears well-developed and well-nourished.  HENT:  Head: Normocephalic and atraumatic.  Right Ear: External ear normal.  Left Ear: External ear normal.  Nose: Nose normal.  Mouth/Throat: Oropharynx is clear and moist.  Eyes: Conjunctivae and EOM are normal. Pupils are equal, round, and reactive to light. Right eye exhibits no discharge.  Neck: Normal range of motion. Neck supple. No tracheal deviation present. No thyromegaly present.  Cardiovascular: Normal rate, regular rhythm, normal heart sounds and intact distal pulses.   No murmur heard. Pulmonary/Chest: Effort normal and breath sounds normal. No respiratory distress. She has no wheezes. She has no rales. She exhibits no tenderness.  Abdominal: Soft. She exhibits no distension and no mass. There is no tenderness. There is no rebound and no guarding.  Genitourinary:  Genitourinary Comments: Pink to tan vulvar rash with thick scale on the right. Normal breast exam without lymphadenopathy, masses or nipple discharge.  Musculoskeletal: Normal range of motion. She exhibits no edema or tenderness.  Crepitus and some enlargement of knee joints (R>L).  Lymphadenopathy:    She has no cervical adenopathy.  Neurological: She is alert and oriented to person, place, and time. She has normal reflexes. No cranial nerve deficit. She exhibits normal muscle tone. Coordination normal.  Skin: Skin is warm and dry. Rash noted. No erythema.  Psoriasis posterior hairline. A few scattered brown to flesh colored seborrheic keratoses.  Psychiatric: She has a normal mood and affect. Her behavior is normal. Judgment and thought content normal.     Depression Screen No flowsheet data found.    Assessment & Plan:     Routine Health Maintenance and Physical Exam  Exercise Activities and Dietary recommendations Goals    Unable to exercise much due to significant arthritis of knees. Recommend low fat diet. May need to consider water aerobics.       Immunization History  Administered Date(s) Administered  . Influenza Split 03/20/2010, 04/02/2011, 02/05/2012  . Influenza,inj,Quad PF,36+ Mos 12/30/2013, 04/12/2015, 12/14/2015  . Td 03/07/2003  . Tdap 08/14/2009  . Zoster 04/12/2015    Health Maintenance  Topic Date Due  . Hepatitis C Screening  06/27/1950  . HIV Screening  08/28/1967  . MAMMOGRAM  04/26/2016  . PAP SMEAR  03/20/2017  . TETANUS/TDAP  08/15/2019  . COLONOSCOPY  06/25/2021  . INFLUENZA VACCINE  Completed  . ZOSTAVAX  Completed     Discussed health benefits of physical activity, and encouraged her to engage in regular exercise appropriate for her age and condition.    -------------------------------------------------------------------- 1. Annual physical exam General health stable. Immunizations up to date. Next colonoscopy due in 2021. Given anticipatory guidance. Continues Trazodone 50 mg 1/2 tablet as needed for sleep disturbance. If she take a whole tablet, she will be drowsy the next day.  Will get routine labs.  2. Benign essential HTN Tolerating Metoprolol without side effects. BP well controlled today. Will check labs and recheck pending reports. - CBC with Differential/Platelet - Comprehensive metabolic panel  3. Osteopenia of multiple sites Last BMD in 2016 showed osteopenia of femur and lumbar spine. Still taking Calcium and Vitamin-D supplements. Will recheck BMD this year to assess progress. - DG Bone Density  4. Primary osteoarthritis of both knees Intermittent pains and crepitus of knees (R>L). Has had orthopedic evaluation in the past with cortisone injection. Rarely using NSAID now. Walks with a cane.  5. Hypercholesterolemia without hypertriglyceridemia Continue low fat diet and exercise as arthritis allows. Recheck labs and follow pending reports. - Comprehensive metabolic panel - Lipid panel - TSH  6. Psoriasis Posterior scalp and vulvar psoriasis. Essentially unchanged. May  continue dandruff shampoo and steroid as prescribed by dermatologist.  7. Breast cancer screening No masses, dimpling or nipple discharge. No local lymphadenopathy. Schedule screening mammograms. - MM Digital Screening  8. Need for hepatitis C screening test - Hepatitis C antibody  9. Screening for HIV (human immunodeficiency virus) - HIV antibody    Vernie Murders, PA  [facility]

## 2016-04-14 NOTE — Patient Instructions (Signed)

## 2016-04-15 ENCOUNTER — Telehealth: Payer: Self-pay

## 2016-04-15 LAB — LIPID PANEL
Chol/HDL Ratio: 3.1 ratio units (ref 0.0–4.4)
Cholesterol, Total: 175 mg/dL (ref 100–199)
HDL: 57 mg/dL (ref >39)
LDL CALC: 96 mg/dL (ref 0–99)
Triglycerides: 111 mg/dL (ref 0–149)
VLDL CHOLESTEROL CAL: 22 mg/dL (ref 5–40)

## 2016-04-15 LAB — CBC WITH DIFFERENTIAL/PLATELET
BASOS ABS: 0 10*3/uL (ref 0.0–0.2)
Basos: 0 %
EOS (ABSOLUTE): 0.1 10*3/uL (ref 0.0–0.4)
Eos: 1 %
Hematocrit: 42.2 % (ref 34.0–46.6)
Hemoglobin: 14.6 g/dL (ref 11.1–15.9)
IMMATURE GRANS (ABS): 0 10*3/uL (ref 0.0–0.1)
IMMATURE GRANULOCYTES: 0 %
LYMPHS: 25 %
Lymphocytes Absolute: 1.6 10*3/uL (ref 0.7–3.1)
MCH: 28.9 pg (ref 26.6–33.0)
MCHC: 34.6 g/dL (ref 31.5–35.7)
MCV: 83 fL (ref 79–97)
Monocytes Absolute: 0.3 10*3/uL (ref 0.1–0.9)
Monocytes: 5 %
NEUTROS PCT: 69 %
Neutrophils Absolute: 4.5 10*3/uL (ref 1.4–7.0)
PLATELETS: 249 10*3/uL (ref 150–379)
RBC: 5.06 x10E6/uL (ref 3.77–5.28)
RDW: 13.7 % (ref 12.3–15.4)
WBC: 6.5 10*3/uL (ref 3.4–10.8)

## 2016-04-15 LAB — COMPREHENSIVE METABOLIC PANEL
ALT: 16 IU/L (ref 0–32)
AST: 23 IU/L (ref 0–40)
Albumin/Globulin Ratio: 2 (ref 1.2–2.2)
Albumin: 4.8 g/dL (ref 3.6–4.8)
Alkaline Phosphatase: 78 IU/L (ref 39–117)
BUN/Creatinine Ratio: 13 (ref 12–28)
BUN: 8 mg/dL (ref 8–27)
Bilirubin Total: 0.6 mg/dL (ref 0.0–1.2)
CALCIUM: 10 mg/dL (ref 8.7–10.3)
CO2: 26 mmol/L (ref 18–29)
Chloride: 99 mmol/L (ref 96–106)
Creatinine, Ser: 0.64 mg/dL (ref 0.57–1.00)
GFR, EST AFRICAN AMERICAN: 110 mL/min/{1.73_m2} (ref >59)
GFR, EST NON AFRICAN AMERICAN: 95 mL/min/{1.73_m2} (ref >59)
GLUCOSE: 103 mg/dL — AB (ref 65–99)
Globulin, Total: 2.4 g/dL (ref 1.5–4.5)
Potassium: 4.4 mmol/L (ref 3.5–5.2)
Sodium: 142 mmol/L (ref 134–144)
TOTAL PROTEIN: 7.2 g/dL (ref 6.0–8.5)

## 2016-04-15 LAB — TSH: TSH: 0.87 u[IU]/mL (ref 0.450–4.500)

## 2016-04-15 LAB — HEPATITIS C ANTIBODY

## 2016-04-15 LAB — HIV ANTIBODY (ROUTINE TESTING W REFLEX): HIV SCREEN 4TH GENERATION: NONREACTIVE

## 2016-04-15 NOTE — Telephone Encounter (Signed)
-----   Message from Margo Common, [place] sent at 04/15/2016  3:29 PM EST ----- All blood tests normal. Recheck annually at physical.

## 2016-04-15 NOTE — Telephone Encounter (Signed)
LMTCB

## 2016-04-21 NOTE — Telephone Encounter (Signed)
Left patient a voicemail advising her that all labs were normal and if she had any further questions to call us back.

## 2016-06-03 ENCOUNTER — Telehealth: Payer: Self-pay

## 2016-06-03 ENCOUNTER — Ambulatory Visit
Admission: RE | Admit: 2016-06-03 | Discharge: 2016-06-03 | Disposition: A | Discharge status: Home / Self Care | Payer: BLUE CROSS/BLUE SHIELD | Source: Ambulatory Visit | Attending: Family Medicine | Admitting: Family Medicine

## 2016-06-03 ENCOUNTER — Ambulatory Visit: Admission: RE | Admit: 2016-06-03 | Payer: Self-pay | Source: Ambulatory Visit

## 2016-06-03 DIAGNOSIS — Z1231 Encounter for screening mammogram for malignant neoplasm of breast: Secondary | ICD-10-CM | POA: Insufficient documentation

## 2016-06-03 NOTE — Telephone Encounter (Signed)
Patient advised as below.  

## 2016-06-03 NOTE — Telephone Encounter (Signed)
-----   Message from Margo Common, [place] sent at 06/03/2016  1:43 PM EST ----- Normal mammograms without sign of cancer. Repeat annually.

## 2016-07-28 ENCOUNTER — Other Ambulatory Visit: Payer: Self-pay | Admitting: Family Medicine

## 2016-07-31 ENCOUNTER — Encounter: Payer: Self-pay | Admitting: Family Medicine

## 2016-11-10 ENCOUNTER — Other Ambulatory Visit: Payer: Self-pay | Admitting: Family Medicine

## 2016-11-10 DIAGNOSIS — G479 Sleep disorder, unspecified: Secondary | ICD-10-CM

## 2016-11-13 ENCOUNTER — Encounter: Payer: Self-pay | Admitting: Family Medicine

## 2016-11-13 ENCOUNTER — Ambulatory Visit (INDEPENDENT_AMBULATORY_CARE_PROVIDER_SITE_OTHER): Payer: BLUE CROSS/BLUE SHIELD | Admitting: Family Medicine

## 2016-11-13 VITALS — BP 104/62 | HR 64 | Temp 97.5°F | Resp 16 | Wt 150.0 lb

## 2016-11-13 DIAGNOSIS — L821 Other seborrheic keratosis: Secondary | ICD-10-CM

## 2016-11-13 DIAGNOSIS — M546 Pain in thoracic spine: Secondary | ICD-10-CM

## 2016-11-13 NOTE — Patient Instructions (Signed)

## 2016-11-13 NOTE — Progress Notes (Signed)
Patient: Claudia Barnes Female    DOB: [birth year]   66 y.o.   MRN: [id number] Visit Date: 11/13/2016  Today's Provider: Lavon Paganini, MD   Chief Complaint  Patient presents with  . Back Pain   Subjective:    Back Pain  This is a new problem. The current episode started in the past 7 days. The problem occurs daily. The problem has been gradually improving (pt reports this is better today because she "hasn't moved much today") since onset. The pain is present in the thoracic spine. Quality: sharp. The pain does not radiate. The pain is at a severity of 7/10. The symptoms are aggravated by twisting. Pertinent negatives include no abdominal pain, bladder incontinence, bowel incontinence, chest pain, dysuria, fever, headaches, leg pain, numbness, pelvic pain, tingling, weakness or weight loss. Risk factors include poor posture. She has tried nothing for the symptoms.    Pt also has some skin issues, including a skin lesion on her left calf that is darkening and "skin tags" - which she describes as white spots of dry skin that appear from time to time.  Spot on left calf has not enlarged.  She does not wear sunscreen regularly.    Allergies  Allergen Reactions  . Codeine Nausea Only     Current Outpatient Prescriptions:  .  Ascorbic Acid (VITAMIN C) 1000 MG tablet, Take 1,000 mg by mouth daily., Disp: , Rfl:  .  lovastatin (MEVACOR) 40 MG tablet, TAKE ONE TABLET AT BEDTIME, Disp: 30 tablet, Rfl: 11 .  metoprolol succinate (TOPROL-XL) 25 MG 24 hr tablet, TAKE ONE (1) TABLET EACH DAY, Disp: 30 tablet, Rfl: 12 .  Multiple Vitamin (MULTIVITAMIN) tablet, Take 1 tablet by mouth daily., Disp: , Rfl:  .  senna (SENOKOT) 8.6 MG tablet, Take 1 tablet by mouth daily., Disp: , Rfl:  .  traZODone (DESYREL) 50 MG tablet, TAKE 1/2 TO 1 TABLET AT BEDTIME AS NEEDED FOR SLEEP, Disp: 30 tablet, Rfl: 6 .  CALCIUM PO, Take by mouth., Disp: , Rfl:  .  Cholecalciferol 1000 UNITS capsule,  Take 2 capsules by mouth daily., Disp: , Rfl:   Review of Systems  Constitutional: Negative for fever and weight loss.  Cardiovascular: Negative for chest pain.  Gastrointestinal: Negative for abdominal pain and bowel incontinence.  Genitourinary: Negative for bladder incontinence, dysuria and pelvic pain.  Musculoskeletal: Positive for back pain.  Skin: Positive for color change (of lesion).  Neurological: Negative for tingling, weakness, numbness and headaches.    Social History  Substance Use Topics  . Smoking status: Never Smoker  . Smokeless tobacco: Never Used  . Alcohol use 0.0 oz/week     Comment: occasionally drinks wine   Objective:   BP 104/62 (BP Location: Left Arm, Patient Position: Sitting, Cuff Size: Normal)   Pulse 64   Temp (!) 97.5 F (36.4 C) (Oral)   Resp 16   Wt 150 lb (68 kg)   BMI 28.58 kg/m  Vitals:   11/13/16 1544  BP: 104/62  Pulse: 64  Resp: 16  Temp: (!) 97.5 F (36.4 C)  TempSrc: Oral  Weight: 150 lb (68 kg)     Physical Exam  Constitutional: She is oriented to person, place, and time. She appears well-developed and well-nourished. No distress.  HENT:  Head: Normocephalic and atraumatic.  Right Ear: External ear normal.  Left Ear: External ear normal.  Eyes: Conjunctivae are normal. No scleral icterus.  Neck: Neck supple.  Cardiovascular: Normal rate,  regular rhythm and normal heart sounds.   No murmur heard. Pulmonary/Chest: Effort normal and breath sounds normal. No respiratory distress. She has no wheezes. She has no rales.  Musculoskeletal: She exhibits no edema.  Back: No TTP over midline or paraspinal musculature. Negative SLR b/l. Sensation to light touch and strength intact in LEs.  Neurological: She is alert and oriented to person, place, and time.  Skin: Skin is warm and dry.  Small seb keratosis on L calf - raised, dry, without irregularities.  Psychiatric: She has a normal mood and affect. Her behavior is normal.    Vitals reviewed.       Assessment & Plan:         1. Acute bilateral thoracic back pain - No red flag symptoms or neurologic deficits - likely muscle strain from lifting and bending - advised conservative measures with tylenol, ice/heat - given back exercises to strengthen and prevent recurrence - return precautions discussed  2. Seborrheic keratosis - reassured patient that this is a benign skin lesion - continue to monitor  The entirety of the information documented in the History of Present Illness, Review of Systems and Physical Exam were personally obtained by me. Portions of this information were initially documented by Raquel Sarna Ratchford, CMA and reviewed by me for thoroughness and accuracy.    Lavon Paganini, MD  [facility]

## 2017-01-22 ENCOUNTER — Ambulatory Visit (INDEPENDENT_AMBULATORY_CARE_PROVIDER_SITE_OTHER): Payer: BLUE CROSS/BLUE SHIELD

## 2017-01-22 ENCOUNTER — Telehealth: Payer: Self-pay | Admitting: Family Medicine

## 2017-01-22 DIAGNOSIS — Z23 Encounter for immunization: Secondary | ICD-10-CM

## 2017-01-22 NOTE — Telephone Encounter (Signed)
Pt wants referral to Dr. Marry Guan for knee pain.  Hurting pretty bad in the back of her knees

## 2017-01-23 NOTE — Telephone Encounter (Signed)
She is already a patient of Dr. Marry Guan (he gave her Synvisc injections to the right knee). She should only need to call his office and schedule the appointment.

## 2017-01-23 NOTE — Telephone Encounter (Signed)
LMTCB

## 2017-01-26 NOTE — Telephone Encounter (Signed)
Patient advised.

## 2017-01-30 ENCOUNTER — Other Ambulatory Visit: Payer: Self-pay | Admitting: Family Medicine

## 2017-03-26 ENCOUNTER — Ambulatory Visit: Payer: BLUE CROSS/BLUE SHIELD | Admitting: Obstetrics and Gynecology

## 2017-03-26 ENCOUNTER — Encounter: Payer: Self-pay | Admitting: Obstetrics and Gynecology

## 2017-03-26 VITALS — BP 128/88 | Ht 60.0 in | Wt 150.0 lb

## 2017-03-26 DIAGNOSIS — Z124 Encounter for screening for malignant neoplasm of cervix: Secondary | ICD-10-CM

## 2017-03-26 DIAGNOSIS — L280 Lichen simplex chronicus: Secondary | ICD-10-CM | POA: Diagnosis not present

## 2017-03-26 MED ORDER — HYDROXYZINE HCL 25 MG PO TABS: 25.0000 mg | ORAL_TABLET | Freq: Every day | ORAL | 12 refills | Status: AC

## 2017-03-26 MED ORDER — CLOBETASOL PROPIONATE 0.05 % EX OINT: TOPICAL_OINTMENT | CUTANEOUS | 12 refills | Status: DC

## 2017-03-26 NOTE — Progress Notes (Signed)
Patient ID: Claudia Barnes, female   DOB: 1951-01-06, 66 y.o.   MRN: [id number]  Reason for Consult: Vaginal Itching   Referred by Margo Common, PA  Subjective:      Chief Complaint: Vulvar itching  HPI:  Claudia Barnes is a 66 y.o. female. Patient here with vaginal itching. She has had itching for 3 years. Itching primarily at nigt. She had had biopsies before, but unsure of results. Has not tried oral medications. Has had prescription creams before, but uncertain of results. Denies abnormal vaginal odor. Denies vaginal discharge. Denies   Past Medical History:  Diagnosis Date  . Hypercholesteremia   . Hypertension    Family History  Problem Relation Age of Onset  . Congestive Heart Failure Father   . Hyperlipidemia Sister   . Hypertension Sister   . Hyperlipidemia Sister   . Hypertension Sister   . COPD Mother   . Depression Mother    Past Surgical History:  Procedure Laterality Date  . APPENDECTOMY  1970  . CESAREAN SECTION  1981    Short Social History:  Social History   Tobacco Use  . Smoking status: Never Smoker  . Smokeless tobacco: Never Used  Substance Use Topics  . Alcohol use: Yes    Alcohol/week: 0.0 oz    Comment: occasionally drinks wine    Allergies  Allergen Reactions  . Codeine Nausea Only    Current Outpatient Medications  Medication Sig Dispense Refill  . Ascorbic Acid (VITAMIN C) 1000 MG tablet Take 1,000 mg by mouth daily.    Marland Kitchen CALCIUM PO Take by mouth.    . lovastatin (MEVACOR) 40 MG tablet TAKE ONE TABLET AT BEDTIME 30 tablet 11  . metoprolol succinate (TOPROL-XL) 25 MG 24 hr tablet TAKE 1 TABLET BY MOUTH ONCE A DAY 30 tablet 12  . Multiple Vitamin (MULTIVITAMIN) tablet Take 1 tablet by mouth daily.    . traZODone (DESYREL) 50 MG tablet TAKE 1/2 TO 1 TABLET AT BEDTIME AS NEEDED FOR SLEEP 30 tablet 6  . Cholecalciferol 1000 UNITS capsule Take 2 capsules by mouth daily.    . clobetasol ointment  (TEMOVATE) 0.05 % Apply to affected area every night for 12 weeks, then two to three nights a week after that as needed. 30 g 12  . hydrOXYzine (ATARAX/VISTARIL) 25 MG tablet Take 1 tablet (25 mg total) by mouth at bedtime. 30 tablet 12  . senna (SENOKOT) 8.6 MG tablet Take 1 tablet by mouth daily.     No current facility-administered medications for this visit.     Review of Systems  Constitutional: Negative for chills, fatigue, fever and unexpected weight change.  HENT: Negative for trouble swallowing.  Eyes: Negative for loss of vision.  Respiratory: Negative for cough, shortness of breath and wheezing.  Cardiovascular: Negative for chest pain, leg swelling, palpitations and syncope.  GI: Negative for abdominal pain, blood in stool, diarrhea, nausea and vomiting.  GU: Negative for difficulty urinating, dysuria, frequency and hematuria.  Musculoskeletal: Negative for back pain, leg pain and joint pain.  Skin: Negative for rash.  Neurological: Negative for dizziness, headaches, light-headedness, numbness and seizures.  Psychiatric: Negative for behavioral problem, confusion, depressed mood and sleep disturbance.        Objective:  Objective   Vitals:   03/26/17 0809  BP: 128/88  Weight: 150 lb (68 kg)  Height: 5' (1.524 m)   Body mass index is 29.29 kg/m.  Physical Exam  Constitutional: She is oriented to  person, place, and time. She appears well-developed and well-nourished.  HENT:  Head: Normocephalic and atraumatic.  Neck: No thyromegaly present.  Cardiovascular: Normal rate.  Pulmonary/Chest: Breath sounds normal.  Abdominal: Soft. She exhibits no distension and no mass. There is no tenderness. There is no rebound and no guarding.  Genitourinary: Vagina normal and uterus normal.  Genitourinary Comments: Patient has red inflammation on both labia majora and extending to anus.  No induration. Some small  No signs of prolaps  No vaginal discharge or odor No single  lesion noted. No papules or plaques.  No adnexal masses. Uterus midline and normal size.   Neurological: She is alert and oriented to person, place, and time.  Skin: Skin is warm and dry.  Psychiatric: She has a normal mood and affect. Her behavior is normal. Judgment and thought content normal.  Nursing note and vitals reviewed.    Assessment/Plan:     Has a bathtub but says it is dirty and she doesn't want to sit in it. Recommended showering to moisten bottom as part of soak and seal twice a day.  Will prescribe nighttime oral antihistamine and topical corticosteroid ointment. If not improvement consider steroid injections.  Pap smear and Nuswab sent.   Homero Fellers MD [facility] 03/26/17 10:25 AM

## 2017-03-28 LAB — PAPIG, CTNGTV, HPV, RFX 16/18
Chlamydia, Nuc. Acid Amp: NEGATIVE
Gonococcus, Nuc. Acid Amp: NEGATIVE
HPV, high-risk: NEGATIVE
PAP Smear Comment: 0
Trich vag by NAA: NEGATIVE

## 2017-03-29 LAB — NUSWAB BV AND CANDIDA, NAA
CANDIDA ALBICANS, NAA: NEGATIVE
CANDIDA GLABRATA, NAA: NEGATIVE

## 2017-04-27 ENCOUNTER — Encounter: Payer: BLUE CROSS/BLUE SHIELD | Admitting: Family Medicine

## 2017-04-28 ENCOUNTER — Encounter: Payer: BLUE CROSS/BLUE SHIELD | Admitting: Family Medicine

## 2017-05-07 ENCOUNTER — Ambulatory Visit (INDEPENDENT_AMBULATORY_CARE_PROVIDER_SITE_OTHER): Payer: BLUE CROSS/BLUE SHIELD | Admitting: Family Medicine

## 2017-05-07 ENCOUNTER — Encounter: Payer: Self-pay | Admitting: Family Medicine

## 2017-05-07 VITALS — BP 124/72 | HR 59 | Temp 98.2°F | Wt 154.0 lb

## 2017-05-07 DIAGNOSIS — Z13820 Encounter for screening for osteoporosis: Secondary | ICD-10-CM

## 2017-05-07 DIAGNOSIS — I10 Essential (primary) hypertension: Secondary | ICD-10-CM

## 2017-05-07 DIAGNOSIS — E7800 Pure hypercholesterolemia, unspecified: Secondary | ICD-10-CM

## 2017-05-07 DIAGNOSIS — Z0000 Encounter for general adult medical examination without abnormal findings: Secondary | ICD-10-CM

## 2017-05-07 DIAGNOSIS — M1711 Unilateral primary osteoarthritis, right knee: Secondary | ICD-10-CM

## 2017-05-07 NOTE — Progress Notes (Signed)
Patient: Claudia Barnes, Female    DOB: Mar 24, 1951, 67 y.o.   MRN: [id number] Visit Date: 05/07/2017  Today's Provider: Vernie Murders, PA   Chief Complaint  Patient presents with  . Annual Exam   Subjective:    Annual physical exam Claudia Barnes is a 67 y.o. female who presents today for health maintenance and complete physical. She feels fairly well. She reports exercising none. She reports she is sleeping poorly.  -----------------------------------------------------------------   Review of Systems  Constitutional: Negative.   HENT: Positive for postnasal drip.   Eyes: Negative.   Respiratory: Negative.   Cardiovascular: Negative.   Gastrointestinal: Negative.   Endocrine: Negative.   Genitourinary: Positive for frequency.  Musculoskeletal: Positive for back pain.       Knee pain   Skin: Negative.   Allergic/Immunologic: Negative.   Neurological: Negative.   Hematological: Negative.   Psychiatric/Behavioral: Negative.     Social History      She  reports that  has never smoked. she has never used smokeless tobacco. She reports that she drinks alcohol. She reports that she does not use drugs.       Social History   Socioeconomic History  . Marital status: Divorced    Spouse name: None  . Number of children: None  . Years of education: None  . Highest education level: None  Social Needs  . Financial resource strain: None  . Food insecurity - worry: None  . Food insecurity - inability: None  . Transportation needs - medical: None  . Transportation needs - non-medical: None  Occupational History  . None  Tobacco Use  . Smoking status: Never Smoker  . Smokeless tobacco: Never Used  Substance and Sexual Activity  . Alcohol use: Yes    Alcohol/week: 0.0 oz    Comment: occasionally drinks wine  . Drug use: No  . Sexual activity: None  Other Topics Concern  . None  Social History Narrative  . None   Past Medical History:    Diagnosis Date  . Hypercholesteremia   . Hypertension    Patient Active Problem List   Diagnosis Date Noted  . Lichen simplex chronicus 03/26/2017  . Psoriasis 04/14/2016  . Acute stress disorder 09/28/2014  . Contagious disease 09/28/2014  . Elevated glycated hemoglobin 09/28/2014  . Primary osteoarthritis of right knee 09/28/2014  . Osteopenia 09/28/2014  . Pain in joint, lower leg 09/28/2014  . Borderline diabetes 09/28/2014  . Avitaminosis D 08/14/2009  . Abnormal blood sugar 04/23/2009  . Cannot sleep 12/11/2008  . History of colon polyps 08/07/2008  . Benign essential HTN 01/13/2008  . Hypercholesterolemia without hypertriglyceridemia 01/13/2008    Past Surgical History:  Procedure Laterality Date  . APPENDECTOMY  1970  . [place]    Family History        Family Status  Relation Name Status  . Father  Deceased at age 31  . Sister 1 Alive  . Sister 2 Alive  . Mother  (Not Specified)        Her family history includes COPD in her mother; Congestive Heart Failure in her father; Depression in her mother; Hyperlipidemia in her sister and sister; Hypertension in her sister and sister.     Allergies  Allergen Reactions  . Codeine Nausea Only    Current Outpatient Medications:  .  Ascorbic Acid (VITAMIN C) 1000 MG tablet, Take 1,000 mg by mouth daily., Disp: , Rfl:  .  CALCIUM PO, Take by mouth., Disp: , Rfl:  .  Cholecalciferol 1000 UNITS capsule, Take 2 capsules by mouth daily., Disp: , Rfl:  .  clobetasol ointment (TEMOVATE) 0.05 %, Apply to affected area every night for 12 weeks, then two to three nights a week after that as needed., Disp: 30 g, Rfl: 12 .  hydrOXYzine (ATARAX/VISTARIL) 25 MG tablet, Take 1 tablet (25 mg total) by mouth at bedtime., Disp: 30 tablet, Rfl: 12 .  lovastatin (MEVACOR) 40 MG tablet, TAKE ONE TABLET AT BEDTIME, Disp: 30 tablet, Rfl: 11 .  metoprolol succinate (TOPROL-XL) 25 MG 24 hr tablet, TAKE 1 TABLET BY MOUTH ONCE A  DAY, Disp: 30 tablet, Rfl: 12 .  Multiple Vitamin (MULTIVITAMIN) tablet, Take 1 tablet by mouth daily., Disp: , Rfl:  .  senna (SENOKOT) 8.6 MG tablet, Take 1 tablet by mouth daily., Disp: , Rfl:  .  traZODone (DESYREL) 50 MG tablet, TAKE 1/2 TO 1 TABLET AT BEDTIME AS NEEDED FOR SLEEP, Disp: 30 tablet, Rfl: 6   Patient Care Team: Makayleigh Poliquin, Vickki Muff, PA as PCP - General (Physician Assistant)      Objective:   Vitals: BP 124/72 (BP Location: Right Arm, Patient Position: Sitting, Cuff Size: Normal)   Pulse (!) 59   Temp 98.2 F (36.8 C) (Oral)   Wt 154 lb (69.9 kg)   SpO2 98%   BMI 30.08 kg/m    Physical Exam  Constitutional: She is oriented to person, place, and time. She appears well-developed and well-nourished.  HENT:  Head: Normocephalic and atraumatic.  Right Ear: External ear normal.  Left Ear: External ear normal.  Nose: Nose normal.  Mouth/Throat: Oropharynx is clear and moist.  Eyes: Conjunctivae and EOM are normal. Pupils are equal, round, and reactive to light. Right eye exhibits no discharge.  Neck: Normal range of motion. Neck supple. No tracheal deviation present. No thyromegaly present.  Cardiovascular: Normal rate, regular rhythm, normal heart sounds and intact distal pulses.  No murmur heard. Pulmonary/Chest: Effort normal and breath sounds normal. No respiratory distress. She has no wheezes. She has no rales. She exhibits no tenderness.  Abdominal: Soft. Bowel sounds are normal. She exhibits no distension and no mass. There is no tenderness. There is no rebound and no guarding.  Genitourinary:  Genitourinary Comments: Accomplished exam and PAP by GYN 03-26-17.  Musculoskeletal: Normal range of motion. She exhibits tenderness. She exhibits no edema.  Crepitus and tenderness in right knee with history of osteoarthritis. Heberden's nodes DIP joints right index and middle fingers.  Lymphadenopathy:    She has no cervical adenopathy.  Neurological: She is alert and  oriented to person, place, and time. She has normal reflexes. No cranial nerve deficit. She exhibits normal muscle tone. Coordination normal.  Skin: Skin is warm and dry. No rash noted. No erythema.  Psychiatric: She has a normal mood and affect. Her behavior is normal. Judgment and thought content normal.    Depression Screen PHQ 2/9 Scores 05/07/2017  PHQ - 2 Score 0  PHQ- 9 Score 5    Assessment & Plan:     Routine Health Maintenance and Physical Exam  Exercise Activities and Dietary recommendations Goals    Not very active. Recommend trying to use stationary bike for exercise.       Immunization History  Administered Date(s) Administered  . Influenza Split 03/20/2010, 04/02/2011, 02/05/2012  . Influenza,inj,Quad PF,6+ Mos 12/30/2013, 04/12/2015, 12/14/2015, 01/22/2017  . Td 03/07/2003  . Tdap 08/14/2009  . Zoster 04/12/2015  Health Maintenance  Topic Date Due  . MAMMOGRAM  06/04/2018  . TETANUS/TDAP  08/15/2019  . PAP SMEAR  03/26/2020  . COLONOSCOPY  06/25/2021  . INFLUENZA VACCINE  Completed  . Hepatitis C Screening  Completed  . HIV Screening  Completed     Discussed health benefits of physical activity, and encouraged her to engage in regular exercise appropriate for her age and condition.    -------------------------------------------------------------------- 1. Annual physical exam Fair general health other than arthritis pains. Had GYN exam and PAP 03-26-17 by Dr. Gilman Schmidt with normal report. Recheck labs. Immunizations up to date. Given anticipatory counseling.  2. Benign essential HTN Stable and well controlled with Metoprolol Succinate 25 mg qd. No chest pains, palpitations or edema. Recheck routine labs. - CBC with Differential/Platelet - Comprehensive metabolic panel - TSH  3. Primary osteoarthritis of right knee Right knee pain with crepitus and history of past x-rays showing degenerative changes and spurs. May use Aleve or Advil prn. Recheck if  no better or worsening. May need orthopedic referral.  4. Hypercholesterolemia without hypertriglyceridemia Tolerating Lovastatin 40 mg qd and trying to work on low fat diet. Recheck labs. - Comprehensive metabolic panel - Lipid panel - TSH  5. Screening for osteoporosis - DG Bone Density   Vernie Murders, PA  [facility]

## 2017-05-08 ENCOUNTER — Telehealth: Payer: Self-pay | Admitting: *Deleted

## 2017-05-08 ENCOUNTER — Other Ambulatory Visit: Payer: Self-pay | Admitting: Family Medicine

## 2017-05-08 LAB — CBC WITH DIFFERENTIAL/PLATELET
Basophils Absolute: 0 10*3/uL (ref 0.0–0.2)
Basos: 0 %
EOS (ABSOLUTE): 0.1 10*3/uL (ref 0.0–0.4)
EOS: 1 %
HEMATOCRIT: 39.8 % (ref 34.0–46.6)
HEMOGLOBIN: 13.5 g/dL (ref 11.1–15.9)
Immature Grans (Abs): 0 10*3/uL (ref 0.0–0.1)
Immature Granulocytes: 0 %
LYMPHS ABS: 1.4 10*3/uL (ref 0.7–3.1)
Lymphs: 24 %
MCH: 29 pg (ref 26.6–33.0)
MCHC: 33.9 g/dL (ref 31.5–35.7)
MCV: 85 fL (ref 79–97)
MONOCYTES: 5 %
MONOS ABS: 0.3 10*3/uL (ref 0.1–0.9)
NEUTROS ABS: 4.2 10*3/uL (ref 1.4–7.0)
Neutrophils: 70 %
Platelets: 227 10*3/uL (ref 150–379)
RBC: 4.66 x10E6/uL (ref 3.77–5.28)
RDW: 14.3 % (ref 12.3–15.4)
WBC: 6 10*3/uL (ref 3.4–10.8)

## 2017-05-08 LAB — COMPREHENSIVE METABOLIC PANEL
A/G RATIO: 2 (ref 1.2–2.2)
ALBUMIN: 4.8 g/dL (ref 3.6–4.8)
ALK PHOS: 79 IU/L (ref 39–117)
ALT: 22 IU/L (ref 0–32)
AST: 27 IU/L (ref 0–40)
BILIRUBIN TOTAL: 0.7 mg/dL (ref 0.0–1.2)
BUN / CREAT RATIO: 9 — AB (ref 12–28)
BUN: 6 mg/dL — ABNORMAL LOW (ref 8–27)
CHLORIDE: 99 mmol/L (ref 96–106)
CO2: 25 mmol/L (ref 20–29)
Calcium: 9.6 mg/dL (ref 8.7–10.3)
Creatinine, Ser: 0.67 mg/dL (ref 0.57–1.00)
GFR calc Af Amer: 107 mL/min/{1.73_m2} (ref >59)
GFR calc non Af Amer: 93 mL/min/{1.73_m2} (ref >59)
GLOBULIN, TOTAL: 2.4 g/dL (ref 1.5–4.5)
Glucose: 98 mg/dL (ref 65–99)
POTASSIUM: 3.9 mmol/L (ref 3.5–5.2)
SODIUM: 142 mmol/L (ref 134–144)
Total Protein: 7.2 g/dL (ref 6.0–8.5)

## 2017-05-08 LAB — LIPID PANEL
CHOLESTEROL TOTAL: 167 mg/dL (ref 100–199)
Chol/HDL Ratio: 3 ratio (ref 0.0–4.4)
HDL: 55 mg/dL (ref >39)
LDL CALC: 95 mg/dL (ref 0–99)
Triglycerides: 84 mg/dL (ref 0–149)
VLDL Cholesterol Cal: 17 mg/dL (ref 5–40)

## 2017-05-08 LAB — TSH: TSH: 0.727 u[IU]/mL (ref 0.450–4.500)

## 2017-05-08 NOTE — Telephone Encounter (Signed)
Patient was notified of results. Expressed understanding.  

## 2017-05-08 NOTE — Telephone Encounter (Signed)
Recommend  Kegel exercises to strengthen pelvic floor muscles. If this progresses, probably will need evaluation by urologist to be sure a bladder suspension procedure is not needed.

## 2017-05-08 NOTE — Telephone Encounter (Signed)
-----   Message from Margo Common, [place] sent at 05/08/2017 10:04 AM EST ----- All blood tests in very good shape. Awaiting bone density test results. If Aleve or Advil does not help the arthritis, may need referral to an orthopedist for an injection.

## 2017-05-08 NOTE — Telephone Encounter (Signed)
Patient stated she forgot to mention at her ov that she has been having bladder leakage when coughing or sneezing.

## 2017-05-08 NOTE — Telephone Encounter (Signed)
No answer. Will try again later. 

## 2017-05-08 NOTE — Telephone Encounter (Signed)
Unable to reach pt and unable to leave message.

## 2017-05-12 NOTE — Telephone Encounter (Signed)
Called pt back and notified

## 2017-06-08 ENCOUNTER — Ambulatory Visit
Admission: RE | Admit: 2017-06-08 | Discharge: 2017-06-08 | Disposition: A | Discharge status: Home / Self Care | Payer: BLUE CROSS/BLUE SHIELD | Source: Ambulatory Visit | Attending: Family Medicine | Admitting: Family Medicine

## 2017-06-08 DIAGNOSIS — Z13820 Encounter for screening for osteoporosis: Secondary | ICD-10-CM | POA: Insufficient documentation

## 2017-06-08 DIAGNOSIS — M8588 Other specified disorders of bone density and structure, other site: Secondary | ICD-10-CM | POA: Diagnosis not present

## 2017-06-24 ENCOUNTER — Other Ambulatory Visit: Payer: Self-pay | Admitting: Family Medicine

## 2017-06-24 DIAGNOSIS — G479 Sleep disorder, unspecified: Secondary | ICD-10-CM

## 2017-07-29 ENCOUNTER — Other Ambulatory Visit: Payer: Self-pay | Admitting: Family Medicine

## 2017-11-24 ENCOUNTER — Other Ambulatory Visit: Payer: Self-pay | Admitting: *Deleted

## 2017-11-24 NOTE — Patient Outreach (Signed)
[facility]) Care Management  11/24/2017  Claudia Barnes [birth year] [phone]   3rd attempt made to call placed to introduce [facility] care management services and perform telephone screening.  No answer.  Unable to leave a message, voice mail full.  Will send outreach letter and [facility] information packet in mail.  If call back received, will perform screening at that time.  Valente David, [place], MSN [place] [phone]

## 2018-01-21 ENCOUNTER — Encounter: Payer: Self-pay | Admitting: Family Medicine

## 2018-01-21 ENCOUNTER — Ambulatory Visit (INDEPENDENT_AMBULATORY_CARE_PROVIDER_SITE_OTHER): Payer: PPO | Admitting: Family Medicine

## 2018-01-21 ENCOUNTER — Other Ambulatory Visit: Payer: Self-pay | Admitting: Family Medicine

## 2018-01-21 VITALS — BP 120/80 | HR 63 | Temp 97.9°F | Resp 16 | Wt 152.6 lb

## 2018-01-21 DIAGNOSIS — Z23 Encounter for immunization: Secondary | ICD-10-CM

## 2018-01-21 DIAGNOSIS — B372 Candidiasis of skin and nail: Secondary | ICD-10-CM | POA: Diagnosis not present

## 2018-01-21 DIAGNOSIS — G479 Sleep disorder, unspecified: Secondary | ICD-10-CM

## 2018-01-21 MED ORDER — NYSTATIN 100000 UNIT/GM EX OINT: 1.0000 "application " | TOPICAL_OINTMENT | Freq: Two times a day (BID) | CUTANEOUS | 2 refills | Status: DC

## 2018-01-21 MED ORDER — TRAZODONE HCL 50 MG PO TABS: ORAL_TABLET | ORAL | 5 refills | Status: DC

## 2018-01-21 NOTE — Telephone Encounter (Signed)
[facility] faxed refill request for the following medications:  traZODone (DESYREL) 50 MG tablet   Qty: 30  Last dispensed: 12/24/2017  Please advise.

## 2018-01-21 NOTE — Progress Notes (Signed)
Patient: Claudia Barnes Female    DOB: 1951/01/17   67 y.o.   MRN: [id number] Visit Date: 01/21/2018  Today's Provider: Lavon Paganini, MD   Chief Complaint  Patient presents with  . Rash   Subjective:    Rash  This is a new problem. The current episode started in the past 7 days. The problem has been waxing and waning since onset. The affected locations include the abdomen. The rash is characterized by itchiness and redness. She was exposed to nothing. Pertinent negatives include no fever or sore throat. Past treatments include antibiotic cream (Clobetasol 0.05% ointment). The treatment provided mild relief.      Allergies  Allergen Reactions  . Codeine Nausea Only     Current Outpatient Medications:  .  Ascorbic Acid (VITAMIN C) 1000 MG tablet, Take 1,000 mg by mouth daily., Disp: , Rfl:  .  CALCIUM PO, Take by mouth., Disp: , Rfl:  .  Cholecalciferol 1000 UNITS capsule, Take 2 capsules by mouth daily., Disp: , Rfl:  .  clobetasol ointment (TEMOVATE) 0.05 %, Apply to affected area every night for 12 weeks, then two to three nights a week after that as needed., Disp: 30 g, Rfl: 12 .  hydrOXYzine (ATARAX/VISTARIL) 25 MG tablet, Take 1 tablet (25 mg total) by mouth at bedtime., Disp: 30 tablet, Rfl: 12 .  lovastatin (MEVACOR) 40 MG tablet, TAKE ONE TABLET AT BEDTIME, Disp: 30 tablet, Rfl: 10 .  metoprolol succinate (TOPROL-XL) 25 MG 24 hr tablet, TAKE 1 TABLET BY MOUTH ONCE A DAY, Disp: 30 tablet, Rfl: 12 .  senna (SENOKOT) 8.6 MG tablet, Take 1 tablet by mouth daily., Disp: , Rfl:  .  traZODone (DESYREL) 50 MG tablet, TAKE ONE-HALF TO 1 TABLETS AT BEDTIME AS NEEDED FOR SLEEP, Disp: 30 tablet, Rfl: 5 .  Multiple Vitamin (MULTIVITAMIN) tablet, Take 1 tablet by mouth daily., Disp: , Rfl:   Review of Systems  Constitutional: Negative.  Negative for fever.  HENT: Negative.  Negative for sore throat.   Respiratory: Negative.   Cardiovascular: Negative.     Gastrointestinal: Negative.   Genitourinary: Negative.   Skin: Positive for color change and rash.  Neurological: Negative.     Social History   Tobacco Use  . Smoking status: Never Smoker  . Smokeless tobacco: Never Used  Substance Use Topics  . Alcohol use: Yes    Alcohol/week: 0.0 standard drinks    Comment: occasionally drinks wine   Objective:   BP 120/80 (BP Location: Left Arm, Patient Position: Sitting, Cuff Size: Normal)   Pulse 63   Temp 97.9 F (36.6 C) (Oral)   Resp 16   Wt 152 lb 9.6 oz (69.2 kg)   SpO2 97%   BMI 29.80 kg/m  Vitals:   01/21/18 0816  BP: 120/80  Pulse: 63  Resp: 16  Temp: 97.9 F (36.6 C)  TempSrc: Oral  SpO2: 97%  Weight: 152 lb 9.6 oz (69.2 kg)     Physical Exam  Constitutional: She is oriented to person, place, and time. She appears well-developed and well-nourished. No distress.  HENT:  Head: Normocephalic and atraumatic.  Eyes: Conjunctivae are normal. No scleral icterus.  Neck: Neck supple. No thyromegaly present.  Cardiovascular: Normal rate, regular rhythm, normal heart sounds and intact distal pulses.  No murmur heard. Pulmonary/Chest: Effort normal and breath sounds normal. No respiratory distress. She has no wheezes. She has no rales.  Abdominal: Soft. She exhibits no distension. There is  no tenderness.  Musculoskeletal: She exhibits no edema.  Lymphadenopathy:    She has no cervical adenopathy.  Neurological: She is alert and oriented to person, place, and time.  Skin: Skin is warm and dry. Rash (R inguinal crease and below pannus with erythematous rash with satellite lesions and excoriations) noted.  Psychiatric: She has a normal mood and affect. Her behavior is normal.  Vitals reviewed.       Assessment & Plan:   1. Candidal intertrigo -Recurrent problem -Treat with Nystatin ointment twice daily until resolution -No signs of secondary bacterial infection -Discussed return precautions -Discussed precautions  for keeping the area clean and dry  2. Need for influenza vaccination - Flu vaccine HIGH DOSE PF  3. Need for pneumococcal vaccine - Pneumococcal conjugate vaccine 13-valent IM    Meds ordered this encounter  Medications  . nystatin ointment (MYCOSTATIN)    Sig: Apply 1 application topically 2 (two) times daily.    Dispense:  60 g    Refill:  2     Return in about 4 months (around 05/24/2018) for Welcome to Medicare (after 05/07/17).   The entirety of the information documented in the History of Present Illness, Review of Systems and Physical Exam were personally obtained by me. Portions of this information were initially documented by Joseline Rosas and [name], [place] and reviewed by me for thoroughness and accuracy.    Virginia Crews, MD, MPH [facility] 01/21/2018 5:08 PM

## 2018-01-21 NOTE — Patient Instructions (Addendum)
The CDC recommends two doses of Shingrix (the shingles vaccine) separated by 2 to 6 months for adults age 65 years and older. I recommend checking with your insurance plan regarding coverage for this vaccine.   Intertrigo Intertrigo is skin irritation or inflammation (dermatitis) that occurs when folds of skin rub together. The irritation can cause a rash and make skin raw and itchy. This condition most commonly occurs in the skin folds of these areas:  Toes.  Armpits.  Groin.  Belly.  Breasts.  Buttocks.  Intertrigo is not passed from person to person (is not contagious). What are the causes? This condition is caused by heat, moisture, friction, and lack of air circulation. The condition can be made worse by:  Sweat.  Bacteria or a fungus, such as yeast.  What increases the risk? This condition is more likely to occur if you have moisture in your skin folds. It is also more likely to develop in people who:  Have diabetes.  Are overweight.  Are on bed rest.  Live in a warm and moist climate.  Wear splints, braces, or other medical devices.  Are not able to control their bowels or bladder (have incontinence).  What are the signs or symptoms? Symptoms of this condition include:  A pink or red skin rash.  Brown patches on the skin.  Raw or scaly skin.  Itchiness.  A burning feeling.  Bleeding.  Leaking fluid.  A bad smell.  How is this diagnosed? This condition is diagnosed with a medical history and physical exam. You may also have a skin swab to test for bacteria or a fungus, such as yeast. How is this treated? Treatment may include:  Cleaning and drying your skin.  An oral antibiotic medicine or antibiotic skin cream for a bacterial infection.  Antifungal cream or pills for an infection that was caused by a fungus, such as yeast.  Steroid ointment to relieve itchiness and irritation.  Follow these instructions at home:  Keep the affected  area clean and dry.  Do not scratch your skin.  Stay in a cool environment as much as possible. Use an air conditioner or fan, if available.  Apply over-the-counter and prescription medicines only as told by your health care provider.  If you were prescribed an antibiotic medicine, use it as told by your health care provider. Do not stop using the antibiotic even if your condition improves.  Keep all follow-up visits as told by your health care provider. This is important. How is this prevented?  Maintain a healthy weight.  Take care of your feet, especially if you have diabetes. Foot care includes: ? Wearing shoes that fit well. ? Keeping your feet dry. ? Wearing clean, breathable socks.  Protect the skin around your groin and buttocks, especially if you have incontinence. Skin protection includes: ? Following a regular cleaning routine. ? Using moisturizers and skin protectants. ? Changing protection pads frequently.  Do not wear tight clothes. Wear clothes that are loose and absorbent. Wear clothes that are made of cotton.  Wear a bra that gives good support, if needed.  Shower and dry yourself thoroughly after activity. Use a hair dryer on a cool setting to dry between skin folds, especially after you bathe.  If you have diabetes, keep your blood sugar under control. Contact a health care provider if:  Your symptoms do not improve with treatment.  Your symptoms get worse or they spread.  You notice increased redness and warmth.  You  have a fever. This information is not intended to replace advice given to you by your health care provider. Make sure you discuss any questions you have with your health care provider. Document Released: 03/17/2005 Document Revised: 08/23/2015 Document Reviewed: 09/18/2014 Elsevier Interactive Patient Education  2018 [employer].

## 2018-01-28 ENCOUNTER — Other Ambulatory Visit: Payer: Self-pay | Admitting: Family Medicine

## 2018-01-28 DIAGNOSIS — G479 Sleep disorder, unspecified: Secondary | ICD-10-CM

## 2018-02-24 ENCOUNTER — Other Ambulatory Visit: Payer: Self-pay | Admitting: Family Medicine

## 2018-02-24 NOTE — Telephone Encounter (Signed)
[facility] faxed refill request for the following medications:  metoprolol succinate (TOPROL-XL) 25 MG 24 hr tablet  Qty: 30  Date written: 01/30/2017  Last dispensed:  01/28/2018  Please advise.

## 2018-02-26 MED ORDER — METOPROLOL SUCCINATE ER 25 MG PO TB24: 25.0000 mg | ORAL_TABLET | Freq: Every day | ORAL | 12 refills | Status: DC

## 2018-04-26 ENCOUNTER — Encounter: Payer: Self-pay | Admitting: Family Medicine

## 2018-04-26 ENCOUNTER — Ambulatory Visit (INDEPENDENT_AMBULATORY_CARE_PROVIDER_SITE_OTHER): Payer: PPO | Admitting: Family Medicine

## 2018-04-26 VITALS — BP 130/79 | HR 69 | Temp 97.5°F | Wt 153.8 lb

## 2018-04-26 DIAGNOSIS — L280 Lichen simplex chronicus: Secondary | ICD-10-CM

## 2018-04-26 DIAGNOSIS — Z1239 Encounter for other screening for malignant neoplasm of breast: Secondary | ICD-10-CM | POA: Diagnosis not present

## 2018-04-26 DIAGNOSIS — Z0000 Encounter for general adult medical examination without abnormal findings: Secondary | ICD-10-CM

## 2018-04-26 MED ORDER — CLOBETASOL PROPIONATE 0.05 % EX OINT: TOPICAL_OINTMENT | CUTANEOUS | 12 refills | Status: AC

## 2018-04-26 NOTE — Patient Instructions (Signed)
Preventive Care 42 Years and Older, Female Preventive care refers to lifestyle choices and visits with your health care provider that can promote health and wellness. What does preventive care include?  A yearly physical exam. This is also called an annual well check.  Dental exams once or twice a year.  Routine eye exams. Ask your health care provider how often you should have your eyes checked.  Personal lifestyle choices, including: ? Daily care of your teeth and gums. ? Regular physical activity. ? Eating a healthy diet. ? Avoiding tobacco and drug use. ? Limiting alcohol use. ? Practicing safe sex. ? Taking low-dose aspirin every day. ? Taking vitamin and mineral supplements as recommended by your health care provider. What happens during an annual well check? The services and screenings done by your health care provider during your annual well check will depend on your age, overall health, lifestyle risk factors, and family history of disease. Counseling Your health care provider may ask you questions about your:  Alcohol use.  Tobacco use.  Drug use.  Emotional well-being.  Home and relationship well-being.  Sexual activity.  Eating habits.  History of falls.  Memory and ability to understand (cognition).  Work and work Statistician.  Reproductive health.  Screening You may have the following tests or measurements:  Height, weight, and BMI.  Blood pressure.  Lipid and cholesterol levels. These may be checked every 5 years, or more frequently if you are over 30 years old.  Skin check.  Lung cancer screening. You may have this screening every year starting at age 27 if you have a 30-pack-year history of smoking and currently smoke or have quit within the past 15 years.  Colorectal cancer screening. All adults should have this screening starting at age 33 and continuing until age 46. You will have tests every 1-10 years, depending on your results and the  type of screening test. People at increased risk should start screening at an earlier age. Screening tests may include: ? Guaiac-based fecal occult blood testing. ? Fecal immunochemical test (FIT). ? Stool DNA test. ? Virtual colonoscopy. ? Sigmoidoscopy. During this test, a flexible tube with a tiny camera (sigmoidoscope) is used to examine your rectum and lower colon. The sigmoidoscope is inserted through your anus into your rectum and lower colon. ? Colonoscopy. During this test, a long, thin, flexible tube with a tiny camera (colonoscope) is used to examine your entire colon and rectum.  Hepatitis C blood test.  Hepatitis B blood test.  Sexually transmitted disease (STD) testing.  Diabetes screening. This is done by checking your blood sugar (glucose) after you have not eaten for a while (fasting). You may have this done every 1-3 years.  Bone density scan. This is done to screen for osteoporosis. You may have this done starting at age 37.  Mammogram. This may be done every 1-2 years. Talk to your health care provider about how often you should have regular mammograms. Talk with your health care provider about your test results, treatment options, and if necessary, the need for more tests. Vaccines Your health care provider may recommend certain vaccines, such as:  Influenza vaccine. This is recommended every year.  Tetanus, diphtheria, and acellular pertussis (Tdap, Td) vaccine. You may need a Td booster every 10 years.  Varicella vaccine. You may need this if you have not been vaccinated.  Zoster vaccine. You may need this after age 38.  Measles, mumps, and rubella (MMR) vaccine. You may need at least  one dose of MMR if you were born in 1957 or later. You may also need a second dose.  Pneumococcal 13-valent conjugate (PCV13) vaccine. One dose is recommended after age 24.  Pneumococcal polysaccharide (PPSV23) vaccine. One dose is recommended after age 24.  Meningococcal  vaccine. You may need this if you have certain conditions.  Hepatitis A vaccine. You may need this if you have certain conditions or if you travel or work in places where you may be exposed to hepatitis A.  Hepatitis B vaccine. You may need this if you have certain conditions or if you travel or work in places where you may be exposed to hepatitis B.  Haemophilus influenzae type b (Hib) vaccine. You may need this if you have certain conditions. Talk to your health care provider about which screenings and vaccines you need and how often you need them. This information is not intended to replace advice given to you by your health care provider. Make sure you discuss any questions you have with your health care provider. Document Released: 04/13/2015 Document Revised: 05/07/2017 Document Reviewed: 01/16/2015 Elsevier Interactive Patient Education  2019 [employer].

## 2018-04-26 NOTE — Progress Notes (Signed)
Patient: Claudia Barnes, Female    DOB: 1950/12/01, 68 y.o.   MRN: [id number] Visit Date: 04/26/2018  Today's Provider: Lavon Paganini, MD   Chief Complaint  Patient presents with  . Annual Exam   Subjective:    First annual wellness visit Claudia Barnes is a 68 y.o. female who presents today for her First Annual Wellness Visit. She feels fairly well. She reports exercising includes none. She reports she is sleeping fairly well.   Review of Systems  Constitutional: Positive for fatigue.  HENT: Negative.   Eyes: Negative.   Respiratory: Negative.   Cardiovascular: Negative.   Gastrointestinal: Positive for constipation.  Endocrine: Negative.   Genitourinary: Negative.   Musculoskeletal: Negative.   Skin: Negative.   Allergic/Immunologic: Negative.   Neurological: Negative.   Hematological: Negative.   Psychiatric/Behavioral: Negative.     Social History   Socioeconomic History  . Marital status: Divorced    Spouse name: Not on file  . Number of children: Not on file  . Years of education: Not on file  . Highest education level: Not on file  Occupational History  . Not on file  Social Needs  . Financial resource strain: Not on file  . Food insecurity:    Worry: Not on file    Inability: Not on file  . Transportation needs:    Medical: Not on file    Non-medical: Not on file  Tobacco Use  . Smoking status: Never Smoker  . Smokeless tobacco: Never Used  Substance and Sexual Activity  . Alcohol use: Yes    Alcohol/week: 0.0 standard drinks    Comment: occasionally drinks wine  . Drug use: No  . Sexual activity: Not on file  Lifestyle  . Physical activity:    Days per week: Not on file    Minutes per session: Not on file  . Stress: Not on file  Relationships  . Social connections:    Talks on phone: Not on file    Gets together: Not on file    Attends religious service: Not on file    Active member of club or  organization: Not on file    Attends meetings of clubs or organizations: Not on file    Relationship status: Not on file  . Intimate partner violence:    Fear of current or ex partner: Not on file    Emotionally abused: Not on file    Physically abused: Not on file    Forced sexual activity: Not on file  Other Topics Concern  . Not on file  Social History Narrative  . Not on file    Past Medical History:  Diagnosis Date  . Hypercholesteremia   . Hypertension     Patient Active Problem List   Diagnosis Date Noted  . Lichen simplex chronicus 03/26/2017  . Psoriasis 04/14/2016  . Acute stress disorder 09/28/2014  . Contagious disease 09/28/2014  . Elevated glycated hemoglobin 09/28/2014  . Primary osteoarthritis of right knee 09/28/2014  . Osteopenia 09/28/2014  . Pain in joint, lower leg 09/28/2014  . Borderline diabetes 09/28/2014  . Avitaminosis D 08/14/2009  . Abnormal blood sugar 04/23/2009  . Cannot sleep 12/11/2008  . History of colon polyps 08/07/2008  . Benign essential HTN 01/13/2008  . Hypercholesterolemia without hypertriglyceridemia 01/13/2008    Past Surgical History:  Procedure Laterality Date  . APPENDECTOMY  1970  . [place]    Her family history includes COPD  in her mother; Congestive Heart Failure in her father; Depression in her mother; Hyperlipidemia in her sister and sister; Hypertension in her sister and sister.      Current Outpatient Medications:  .  Ascorbic Acid (VITAMIN C) 1000 MG tablet, Take 1,000 mg by mouth daily., Disp: , Rfl:  .  CALCIUM PO, Take by mouth., Disp: , Rfl:  .  Cholecalciferol 1000 UNITS capsule, Take 2 capsules by mouth daily., Disp: , Rfl:  .  clobetasol ointment (TEMOVATE) 0.05 %, Apply to affected area every night for 12 weeks, then two to three nights a week after that as needed., Disp: 30 g, Rfl: 12 .  hydrOXYzine (ATARAX/VISTARIL) 25 MG tablet, Take 1 tablet (25 mg total) by mouth at bedtime., Disp:  30 tablet, Rfl: 12 .  lovastatin (MEVACOR) 40 MG tablet, TAKE ONE TABLET AT BEDTIME, Disp: 30 tablet, Rfl: 10 .  metoprolol succinate (TOPROL-XL) 25 MG 24 hr tablet, Take 1 tablet (25 mg total) by mouth daily., Disp: 30 tablet, Rfl: 12 .  Multiple Vitamin (MULTIVITAMIN) tablet, Take 1 tablet by mouth daily., Disp: , Rfl:  .  nystatin ointment (MYCOSTATIN), Apply 1 application topically 2 (two) times daily., Disp: 60 g, Rfl: 2 .  senna (SENOKOT) 8.6 MG tablet, Take 1 tablet by mouth daily., Disp: , Rfl:  .  traZODone (DESYREL) 50 MG tablet, TAKE ONE-HALF TO 1 TABLETS AT BEDTIME AS NEEDED FOR SLEEP, Disp: 30 tablet, Rfl: 4   Patient Care Team: Chrismon, Vickki Muff, PA as PCP - General (Physician Assistant)      Objective:   Vitals: BP 130/79 (BP Location: Left Arm, Patient Position: Sitting, Cuff Size: Normal)   Pulse 69   Temp (!) 97.5 F (36.4 C) (Oral)   Wt 153 lb 12.8 oz (69.8 kg)   SpO2 95%   BMI 30.04 kg/m   Physical Exam Vitals signs reviewed.  Constitutional:      General: She is not in acute distress.    Appearance: Normal appearance. She is well-developed. She is not diaphoretic.  HENT:     Head: Normocephalic and atraumatic.     Right Ear: Tympanic membrane, ear canal and external ear normal.     Left Ear: Tympanic membrane, ear canal and external ear normal.     Nose: Nose normal. No rhinorrhea.     Mouth/Throat:     Mouth: Mucous membranes are moist.     Pharynx: Oropharynx is clear. No oropharyngeal exudate.  Eyes:     General: No scleral icterus.    Conjunctiva/sclera: Conjunctivae normal.     Pupils: Pupils are equal, round, and reactive to light.  Neck:     Musculoskeletal: Neck supple.     Thyroid: No thyromegaly.  Cardiovascular:     Rate and Rhythm: Normal rate and regular rhythm.     Pulses: Normal pulses.     Heart sounds: Normal heart sounds. No murmur.  Pulmonary:     Effort: Pulmonary effort is normal. No respiratory distress.     Breath sounds:  Normal breath sounds. No wheezing or rales.  Abdominal:     General: Bowel sounds are normal. There is no distension.     Palpations: Abdomen is soft.     Tenderness: There is no abdominal tenderness. There is no guarding or rebound.  Genitourinary:    Comments: Breasts: breasts appear normal, no suspicious masses, no skin or nipple changes or axillary nodes.  Musculoskeletal:        General: No deformity.  Right lower leg: No edema.     Left lower leg: No edema.  Lymphadenopathy:     Cervical: No cervical adenopathy.  Skin:    General: Skin is warm and dry.     Capillary Refill: Capillary refill takes less than 2 seconds.     Findings: No rash.  Neurological:     Mental Status: She is alert and oriented to person, place, and time. Mental status is at baseline.     Sensory: No sensory deficit.  Psychiatric:        Mood and Affect: Mood normal.        Behavior: Behavior normal.        Thought Content: Thought content normal.     Activities of Daily Living In your present state of health, do you have any difficulty performing the following activities: 04/26/2018 05/07/2017  Hearing? N Y  Vision? N Y  Difficulty concentrating or making decisions? N Y  Walking or climbing stairs? Y Y  Dressing or bathing? N N  Doing errands, shopping? N N  Some recent data might be hidden    Fall Risk Assessment Fall Risk  04/26/2018 05/07/2017  Falls in the past year? 0 No  Number falls in past yr: 0 -  Injury with Fall? 0 -     Depression Screen PHQ 2/9 Scores 04/26/2018 05/07/2017  PHQ - 2 Score 2 0  PHQ- 9 Score 7 5    No flowsheet data found.    Assessment & Plan:     Annual Wellness Visit  Reviewed patient's Family Medical History Reviewed and updated list of patient's medical providers Assessment of cognitive impairment was done Assessed patient's functional ability Established a written schedule for health screening [place] Completed and  Reviewed  Exercise Activities and Dietary recommendations Goals   None     Immunization History  Administered Date(s) Administered  . Influenza Split 03/20/2010, 04/02/2011, 02/05/2012  . Influenza, High Dose Seasonal PF 01/21/2018  . Influenza,inj,Quad PF,6+ Mos 12/30/2013, 04/12/2015, 12/14/2015, 01/22/2017  . Pneumococcal Conjugate-13 01/21/2018  . Td 03/07/2003  . Tdap 08/14/2009  . Zoster 04/12/2015    Health Maintenance  Topic Date Due  . MAMMOGRAM  06/04/2018  . PNA vac Low Risk Adult (2 of 2 - PPSV23) 01/22/2019  . TETANUS/TDAP  08/15/2019  . PAP SMEAR-Modifier  03/26/2020  . COLONOSCOPY  06/25/2021  . INFLUENZA VACCINE  Completed  . DEXA SCAN  Completed  . Hepatitis C Screening  Completed  . HIV Screening  Completed     Discussed health benefits of physical activity, and encouraged her to engage in regular exercise appropriate for her age and condition.    ------------------------------------------------------------------------------------------------------------  Problem List Items Addressed This Visit      Musculoskeletal and Integument   Lichen simplex chronicus - pulled for medication refill, did not discuss   Relevant Medications   clobetasol ointment (TEMOVATE) 0.05 %    Other Visit Diagnoses    Medicare welcome exam    -  Primary   Relevant Orders   EKG 12-Lead (Completed)   Screening for breast cancer       Relevant Orders   MM 3D SCREEN BREAST BILATERAL       Return in about 4 weeks (around 05/24/2018) for chronic disease f/u.   The entirety of the information documented in the History of Present Illness, Review of Systems and Physical Exam were personally obtained by me. Portions of this information were initially documented by [facility]  [place], CMA and reviewed by me for thoroughness and accuracy.    Virginia Crews, MD, MPH [facility] 04/26/2018 9:29 AM

## 2018-05-11 DIAGNOSIS — M1711 Unilateral primary osteoarthritis, right knee: Secondary | ICD-10-CM | POA: Diagnosis not present

## 2018-05-24 ENCOUNTER — Encounter: Payer: Self-pay | Admitting: Family Medicine

## 2018-05-24 ENCOUNTER — Ambulatory Visit (INDEPENDENT_AMBULATORY_CARE_PROVIDER_SITE_OTHER): Payer: PPO | Admitting: Family Medicine

## 2018-05-24 VITALS — BP 134/78 | HR 58 | Temp 98.3°F | Wt 152.2 lb

## 2018-05-24 DIAGNOSIS — I10 Essential (primary) hypertension: Secondary | ICD-10-CM

## 2018-05-24 DIAGNOSIS — E7800 Pure hypercholesterolemia, unspecified: Secondary | ICD-10-CM | POA: Diagnosis not present

## 2018-05-24 DIAGNOSIS — R739 Hyperglycemia, unspecified: Secondary | ICD-10-CM | POA: Diagnosis not present

## 2018-05-24 NOTE — Patient Instructions (Signed)

## 2018-05-24 NOTE — Assessment & Plan Note (Signed)
Discussed low carb diet Recheck A1c Not on medications

## 2018-05-24 NOTE — Assessment & Plan Note (Signed)
Well controlled Continue current medications Recheck metabolic panel F/u in 6 months  

## 2018-05-24 NOTE — Assessment & Plan Note (Signed)
Stable continue lovastatin Recheck CMP and FLP

## 2018-05-24 NOTE — Progress Notes (Signed)
Patient: Claudia Barnes Female    DOB: [birth year]   68 y.o.   MRN: [id number] Visit Date: 05/24/2018  Today's Provider: Lavon Paganini, MD   Chief Complaint  Patient presents with  . Hypertension  . Hyperlipidemia   Subjective:    I, [name], CMA, am acting as a Education administrator for Lavon Paganini, MD.   HPI  Lipid/Cholesterol, Follow-up:   Last seen for this 1 years ago.  Management changes since that visit include no changes. . Last Lipid Panel:    Component Value Date/Time   CHOL 167 05/07/2017 1042   TRIG 84 05/07/2017 1042   HDL 55 05/07/2017 1042   CHOLHDL 3.0 05/07/2017 1042   LDLCALC 95 05/07/2017 1042    Risk factors for vascular disease include hypertension  She reports good compliance with treatment. She is not having side effects.  Current symptoms include none  Weight trend: stable  Current diet: in general, an "unhealthy" diet Current exercise: none  Wt Readings from Last 3 Encounters:  05/24/18 152 lb 3.2 oz (69 kg)  04/26/18 153 lb 12.8 oz (69.8 kg)  01/21/18 152 lb 9.6 oz (69.2 kg)    -------------------------------------------------------------------  Hypertension, follow-up:  BP Readings from Last 3 Encounters:  05/24/18 134/78  04/26/18 130/79  01/21/18 120/80    She was last seen for hypertension 1 years ago.  BP at that visit was 124/72. Management changes since that visit include no changes. She reports good compliance with treatment. She is not having side effects.  She is not exercising. She is not adherent to low salt diet.   Outside blood pressures are not being checked at home. She is experiencing none.  Patient denies chest pain, chest pressure/discomfort, claudication, dyspnea, exertional chest pressure/discomfort, fatigue, irregular heart beat, lower extremity edema, near-syncope, orthopnea, palpitations, paroxysmal nocturnal dyspnea, syncope and tachypnea.   Cardiovascular risk factors include advanced  age (older than 22 for men, 69 for women), dyslipidemia, hypertension and obesity (BMI >= 30 kg/m2).  Use of agents associated with hypertension: none.     Weight trend: stable Wt Readings from Last 3 Encounters:  05/24/18 152 lb 3.2 oz (69 kg)  04/26/18 153 lb 12.8 oz (69.8 kg)  01/21/18 152 lb 9.6 oz (69.2 kg)    Current diet: in general, an "unhealthy" diet  ------------------------------------------------------------------------  Allergies  Allergen Reactions  . Codeine Nausea Only     Current Outpatient Medications:  .  Cholecalciferol 1000 UNITS capsule, Take 2 capsules by mouth daily., Disp: , Rfl:  .  clobetasol ointment (TEMOVATE) 0.05 %, Apply to affected area two to three nights a week as needed., Disp: 30 g, Rfl: 12 .  hydrOXYzine (ATARAX/VISTARIL) 25 MG tablet, Take 1 tablet (25 mg total) by mouth at bedtime., Disp: 30 tablet, Rfl: 12 .  lovastatin (MEVACOR) 40 MG tablet, TAKE ONE TABLET AT BEDTIME, Disp: 30 tablet, Rfl: 10 .  metoprolol succinate (TOPROL-XL) 25 MG 24 hr tablet, Take 1 tablet (25 mg total) by mouth daily., Disp: 30 tablet, Rfl: 12 .  Multiple Vitamin (MULTIVITAMIN) tablet, Take 1 tablet by mouth daily., Disp: , Rfl:  .  traZODone (DESYREL) 50 MG tablet, TAKE ONE-HALF TO 1 TABLETS AT BEDTIME AS NEEDED FOR SLEEP, Disp: 30 tablet, Rfl: 4 .  Ascorbic Acid (VITAMIN C) 1000 MG tablet, Take 1,000 mg by mouth daily., Disp: , Rfl:  .  CALCIUM PO, Take by mouth., Disp: , Rfl:  .  nystatin ointment (MYCOSTATIN), Apply 1 application topically  2 (two) times daily. (Patient not taking: Reported on 05/24/2018), Disp: 60 g, Rfl: 2 .  senna (SENOKOT) 8.6 MG tablet, Take 1 tablet by mouth daily., Disp: , Rfl:   Review of Systems  Constitutional: Negative.   Respiratory: Negative.   Cardiovascular: Negative.   Genitourinary: Negative.   Musculoskeletal: Negative.   Neurological: Negative.   Psychiatric/Behavioral: Negative.     Social History   Tobacco Use  .  Smoking status: Never Smoker  . Smokeless tobacco: Never Used  Substance Use Topics  . Alcohol use: Yes    Alcohol/week: 0.0 standard drinks    Comment: occasionally drinks wine      Objective:   BP 134/78 (BP Location: Right Arm, Patient Position: Sitting, Cuff Size: Normal)   Pulse (!) 58   Temp 98.3 F (36.8 C) (Oral)   Wt 152 lb 3.2 oz (69 kg)   SpO2 99%   BMI 29.72 kg/m  Vitals:   05/24/18 0816  BP: 134/78  Pulse: (!) 58  Temp: 98.3 F (36.8 C)  TempSrc: Oral  SpO2: 99%  Weight: 152 lb 3.2 oz (69 kg)     Physical Exam Vitals signs reviewed.  Constitutional:      General: She is not in acute distress.    Appearance: Normal appearance. She is well-developed. She is not diaphoretic.  HENT:     Head: Normocephalic and atraumatic.  Eyes:     General: No scleral icterus.    Conjunctiva/sclera: Conjunctivae normal.  Neck:     Musculoskeletal: Neck supple.     Thyroid: No thyromegaly.  Cardiovascular:     Rate and Rhythm: Normal rate and regular rhythm.     Pulses: Normal pulses.     Heart sounds: Normal heart sounds. No murmur.  Pulmonary:     Effort: Pulmonary effort is normal. No respiratory distress.     Breath sounds: Normal breath sounds. No wheezing, rhonchi or rales.  Abdominal:     General: There is no distension.     Palpations: Abdomen is soft.     Tenderness: There is no abdominal tenderness.  Musculoskeletal:     Right lower leg: No edema.     Left lower leg: No edema.  Lymphadenopathy:     Cervical: No cervical adenopathy.  Skin:    General: Skin is warm and dry.     Capillary Refill: Capillary refill takes less than 2 seconds.     Findings: No rash.  Neurological:     Mental Status: She is alert and oriented to person, place, and time. Mental status is at baseline.  Psychiatric:        Mood and Affect: Mood normal.        Behavior: Behavior normal.        Assessment & Plan   Problem List Items Addressed This Visit       Cardiovascular and Mediastinum   Benign essential HTN - Primary    Well controlled Continue current medications Recheck metabolic panel F/u in 6 months       Relevant Orders   Comprehensive metabolic panel     Other   Hyperglycemia    Discussed low carb diet Recheck A1c Not on medications      Relevant Orders   Hemoglobin A1c   Hypercholesterolemia without hypertriglyceridemia    Stable continue lovastatin Recheck CMP and FLP      Relevant Orders   Comprehensive metabolic panel   Lipid panel       Return in  about 6 months (around 11/22/2018) for chronic disease f/u.   The entirety of the information documented in the History of Present Illness, Review of Systems and Physical Exam were personally obtained by me. Portions of this information were initially documented by [name], CMA and reviewed by me for thoroughness and accuracy.    Virginia Crews, MD, MPH [facility] 05/24/2018 9:40 AM

## 2018-05-25 LAB — LIPID PANEL
CHOLESTEROL TOTAL: 172 mg/dL (ref 100–199)
Chol/HDL Ratio: 3.7 ratio (ref 0.0–4.4)
HDL: 47 mg/dL (ref >39)
LDL Calculated: 105 mg/dL — ABNORMAL HIGH (ref 0–99)
Triglycerides: 101 mg/dL (ref 0–149)
VLDL CHOLESTEROL CAL: 20 mg/dL (ref 5–40)

## 2018-05-25 LAB — COMPREHENSIVE METABOLIC PANEL
ALBUMIN: 4.6 g/dL (ref 3.8–4.8)
ALK PHOS: 88 IU/L (ref 39–117)
ALT: 27 IU/L (ref 0–32)
AST: 30 IU/L (ref 0–40)
Albumin/Globulin Ratio: 2.1 (ref 1.2–2.2)
BILIRUBIN TOTAL: 0.7 mg/dL (ref 0.0–1.2)
BUN/Creatinine Ratio: 10 — ABNORMAL LOW (ref 12–28)
BUN: 8 mg/dL (ref 8–27)
CHLORIDE: 98 mmol/L (ref 96–106)
CO2: 25 mmol/L (ref 20–29)
Calcium: 9.7 mg/dL (ref 8.7–10.3)
Creatinine, Ser: 0.81 mg/dL (ref 0.57–1.00)
GFR calc non Af Amer: 76 mL/min/{1.73_m2} (ref >59)
GFR, EST AFRICAN AMERICAN: 88 mL/min/{1.73_m2} (ref >59)
GLUCOSE: 102 mg/dL — AB (ref 65–99)
Globulin, Total: 2.2 g/dL (ref 1.5–4.5)
Potassium: 4.1 mmol/L (ref 3.5–5.2)
Sodium: 140 mmol/L (ref 134–144)
TOTAL PROTEIN: 6.8 g/dL (ref 6.0–8.5)

## 2018-05-25 LAB — HEMOGLOBIN A1C
ESTIMATED AVERAGE GLUCOSE: 120 mg/dL
HEMOGLOBIN A1C: 5.8 % — AB (ref 4.8–5.6)

## 2018-06-25 ENCOUNTER — Other Ambulatory Visit: Payer: Self-pay

## 2018-06-25 DIAGNOSIS — G479 Sleep disorder, unspecified: Secondary | ICD-10-CM

## 2018-06-25 MED ORDER — TRAZODONE HCL 50 MG PO TABS: ORAL_TABLET | ORAL | 2 refills | Status: DC

## 2018-06-25 NOTE — Telephone Encounter (Signed)
Patient requesting refill on Trazodone be sent to [facility].KW

## 2018-06-28 ENCOUNTER — Other Ambulatory Visit: Payer: Self-pay | Admitting: Family Medicine

## 2018-06-28 DIAGNOSIS — G479 Sleep disorder, unspecified: Secondary | ICD-10-CM

## 2018-07-23 ENCOUNTER — Other Ambulatory Visit: Payer: Self-pay | Admitting: Family Medicine

## 2018-09-24 ENCOUNTER — Other Ambulatory Visit: Payer: Self-pay | Admitting: Family Medicine

## 2018-09-24 DIAGNOSIS — G479 Sleep disorder, unspecified: Secondary | ICD-10-CM

## 2018-11-04 ENCOUNTER — Encounter: Payer: Self-pay | Admitting: Family Medicine

## 2018-11-04 ENCOUNTER — Ambulatory Visit (INDEPENDENT_AMBULATORY_CARE_PROVIDER_SITE_OTHER): Payer: PPO | Admitting: Family Medicine

## 2018-11-04 ENCOUNTER — Other Ambulatory Visit: Payer: Self-pay

## 2018-11-04 DIAGNOSIS — R300 Dysuria: Secondary | ICD-10-CM

## 2018-11-04 MED ORDER — NITROFURANTOIN MONOHYD MACRO 100 MG PO CAPS: 100.0000 mg | ORAL_CAPSULE | Freq: Two times a day (BID) | ORAL | 0 refills | Status: DC

## 2018-11-04 NOTE — Progress Notes (Signed)
Claudia Barnes  MRN: [id number] DOB: 1950-04-02 Virtual Visit via Telephone Note  I connected with Claudia Barnes on 11/04/18 at 10:20 AM EDT by telephone and verified that I am speaking with the correct person using two identifiers.  Location: Patient: Home Provider: Office   I discussed the limitations, risks, security and privacy concerns of performing an evaluation and management service by telephone and the availability of in person appointments. I also discussed with the patient that there may be a patient responsible charge related to this service. The patient expressed understanding and agreed to proceed.  Subjective:  HPI   The patient is a 68 year old female who presents via phone visit for concern of possible exposure to someone that is being tested for COVID 19  Patient has been having dysuria about 1 week.  She has urgency and notes no blood and has had no fever.  Patient Active Problem List   Diagnosis Date Noted  . Lichen simplex chronicus 03/26/2017  . Psoriasis 04/14/2016  . Acute stress disorder 09/28/2014  . Primary osteoarthritis of right knee 09/28/2014  . Osteopenia 09/28/2014  . Pain in joint, lower leg 09/28/2014  . Avitaminosis D 08/14/2009  . Hyperglycemia 04/23/2009  . Cannot sleep 12/11/2008  . History of colon polyps 08/07/2008  . Benign essential HTN 01/13/2008  . Hypercholesterolemia without hypertriglyceridemia 01/13/2008    Past Medical History:  Diagnosis Date  . Hypercholesteremia   . Hypertension     Social History   Socioeconomic History  . Marital status: Divorced    Spouse name: Not on file  . Number of children: Not on file  . Years of education: Not on file  . Highest education level: Not on file  Occupational History  . Not on file  Social Needs  . Financial resource strain: Not on file  . Food insecurity    Worry: Not on file    Inability: Not on file  . Transportation needs    Medical: Not  on file    Non-medical: Not on file  Tobacco Use  . Smoking status: Never Smoker  . Smokeless tobacco: Never Used  Substance and Sexual Activity  . Alcohol use: Yes    Alcohol/week: 0.0 standard drinks    Comment: occasionally drinks wine  . Drug use: No  . Sexual activity: Not on file  Lifestyle  . Physical activity    Days per week: Not on file    Minutes per session: Not on file  . Stress: Not on file  Relationships  . Social Herbalist on phone: Not on file    Gets together: Not on file    Attends religious service: Not on file    Active member of club or organization: Not on file    Attends meetings of clubs or organizations: Not on file    Relationship status: Not on file  . Intimate partner violence    Fear of current or ex partner: Not on file    Emotionally abused: Not on file    Physically abused: Not on file    Forced sexual activity: Not on file  Other Topics Concern  . Not on file  Social History Narrative  . Not on file    Outpatient Encounter Medications as of 11/04/2018  Medication Sig  . Ascorbic Acid (VITAMIN C) 1000 MG tablet Take 1,000 mg by mouth daily.  Marland Kitchen CALCIUM PO Take by mouth.  . Cholecalciferol 1000 UNITS capsule Take  2 capsules by mouth daily.  . clobetasol ointment (TEMOVATE) 0.05 % Apply to affected area two to three nights a week as needed.  . hydrOXYzine (ATARAX/VISTARIL) 25 MG tablet Take 1 tablet (25 mg total) by mouth at bedtime.  . lovastatin (MEVACOR) 40 MG tablet TAKE 1 TABLET BY MOUTH AT BEDTIME  . metoprolol succinate (TOPROL-XL) 25 MG 24 hr tablet Take 1 tablet (25 mg total) by mouth daily.  . Multiple Vitamin (MULTIVITAMIN) tablet Take 1 tablet by mouth daily.  Marland Kitchen nystatin ointment (MYCOSTATIN) Apply 1 application topically 2 (two) times daily. (Patient not taking: Reported on 05/24/2018)  . senna (SENOKOT) 8.6 MG tablet Take 1 tablet by mouth daily.  . traZODone (DESYREL) 50 MG tablet TAKE 1/2 TO 1 TABLET BY MOUTH AT  BEDTIME AS NEEDED FOR SLEEP   No facility-administered encounter medications on file as of 11/04/2018.     Allergies  Allergen Reactions  . Codeine Nausea Only    Review of Systems  Constitutional: Negative for fever.  HENT: Positive for congestion (runny nose). Negative for sinus pain and sore throat.   Respiratory: Negative for cough and shortness of breath.   Genitourinary: Positive for dysuria, frequency and urgency. Negative for flank pain and hematuria.    Objective:  There were no vitals taken for this visit.  No apparent distress during telephonic conference.  Assessment and Plan :   1. Dysuria Onset of burning with frequent urination over the past 5-6 days. No fever or respiratory symptoms of significance. Some rhinorrhea with allergies intermittently. May use antihistamine. Will add Macrobid and may use AZO-Standard prn discomfort with urination. Increase fluid intake and recheck if no better in 3-4 days. Denies fever, cough or shortness of breath. - nitrofurantoin, macrocrystal-monohydrate, (MACROBID) 100 MG capsule; Take 1 capsule (100 mg total) by mouth 2 (two) times daily.  Dispense: 14 capsule; Refill: 0  Follow Up Instructions:    I discussed the assessment and treatment plan with the patient. The patient was provided an opportunity to ask questions and all were answered. The patient agreed with the plan and demonstrated an understanding of the instructions.   The patient was advised to call back or seek an in-person evaluation if the symptoms worsen or if the condition fails to improve as anticipated.  I provided 15 minutes of non-face-to-face time during this encounter.   Vernie Murders, PA

## 2018-11-09 DIAGNOSIS — M1711 Unilateral primary osteoarthritis, right knee: Secondary | ICD-10-CM | POA: Diagnosis not present

## 2018-11-22 ENCOUNTER — Encounter: Payer: Self-pay | Admitting: Family Medicine

## 2018-11-22 ENCOUNTER — Ambulatory Visit (INDEPENDENT_AMBULATORY_CARE_PROVIDER_SITE_OTHER): Payer: PPO | Admitting: Family Medicine

## 2018-11-22 ENCOUNTER — Other Ambulatory Visit: Payer: Self-pay

## 2018-11-22 VITALS — BP 133/74 | HR 73 | Temp 96.8°F | Wt 154.8 lb

## 2018-11-22 DIAGNOSIS — I10 Essential (primary) hypertension: Secondary | ICD-10-CM | POA: Diagnosis not present

## 2018-11-22 DIAGNOSIS — Z1211 Encounter for screening for malignant neoplasm of colon: Secondary | ICD-10-CM

## 2018-11-22 DIAGNOSIS — E7800 Pure hypercholesterolemia, unspecified: Secondary | ICD-10-CM

## 2018-11-22 DIAGNOSIS — L570 Actinic keratosis: Secondary | ICD-10-CM | POA: Diagnosis not present

## 2018-11-22 DIAGNOSIS — Z6830 Body mass index (BMI) 30.0-30.9, adult: Secondary | ICD-10-CM | POA: Diagnosis not present

## 2018-11-22 DIAGNOSIS — L600 Ingrowing nail: Secondary | ICD-10-CM

## 2018-11-22 DIAGNOSIS — E669 Obesity, unspecified: Secondary | ICD-10-CM | POA: Insufficient documentation

## 2018-11-22 DIAGNOSIS — R739 Hyperglycemia, unspecified: Secondary | ICD-10-CM | POA: Diagnosis not present

## 2018-11-22 DIAGNOSIS — R7303 Prediabetes: Secondary | ICD-10-CM | POA: Diagnosis not present

## 2018-11-22 NOTE — Assessment & Plan Note (Signed)
Discussed low carb diet Recheck a1c F/u in 6 months

## 2018-11-22 NOTE — Assessment & Plan Note (Signed)
Discussed importance of healthy weight management Discussed diet and exercise  

## 2018-11-22 NOTE — Assessment & Plan Note (Signed)
Continue lovastatin Recheck CMP and FLP

## 2018-11-22 NOTE — Progress Notes (Signed)
Patient: Claudia Barnes Female    DOB: [birth year]   68 y.o.   MRN: [id number] Visit Date: 11/22/2018  Today's Provider: Lavon Paganini, MD   Chief Complaint  Patient presents with  . Hypertension  . Hyperlipidemia   Subjective:    I, Porsha McClurkin CMA, am acting as a scribe for Lavon Paganini, MD.   HPI  Hypertension, follow-up:  BP Readings from Last 3 Encounters:  11/22/18 133/74  05/24/18 134/78  04/26/18 130/79    She was last seen for hypertension 6 months ago.  BP at that visit was 134/78. Management changes since that visit include none . She reports good compliance with treatment. She is not having side effects.  She is not exercising. She is adherent to low salt diet.   Outside blood pressures are not checking. She is experiencing none.  Patient denies chest pain, chest pressure/discomfort, exertional chest pressure/discomfort, fatigue, irregular heart beat, lower extremity edema, palpitations and tachypnea.   Cardiovascular risk factors include advanced age (older than 46 for men, 46 for women) and hypertension.  Use of agents associated with hypertension: none.     Weight trend: stable Wt Readings from Last 3 Encounters:  11/22/18 154 lb 12.8 oz (70.2 kg)  05/24/18 152 lb 3.2 oz (69 kg)  04/26/18 153 lb 12.8 oz (69.8 kg)    Current diet: well balanced  ------------------------------------------------------------------------   Lipid/Cholesterol, Follow-up:   Last seen for this6 months ago.  Management changes since that visit include none. . Last Lipid Panel:    Component Value Date/Time   CHOL 172 05/24/2018 0843   TRIG 101 05/24/2018 0843   HDL 47 05/24/2018 0843   CHOLHDL 3.7 05/24/2018 0843   LDLCALC 105 (H) 05/24/2018 0843    Risk factors for vascular disease include hypercholesterolemia and hypertension  She reports good compliance with treatment. She is not having side effects.  Current symptoms include  none and have been stable. Weight trend: stable Prior visit with dietician: no Current diet: well balanced Current exercise: none  Wt Readings from Last 3 Encounters:  11/22/18 154 lb 12.8 oz (70.2 kg)  05/24/18 152 lb 3.2 oz (69 kg)  04/26/18 153 lb 12.8 oz (69.8 kg)    ------------------------------------------------------------------- States that she did not go to see Derm about AKs and would like referral  Would like referral to podiatry for incurved toenails and thickening of toenails  Colon cancer screening: She does not believe that she had colonsocopy in 2013 as stated in chart (I pulled up report and will scan to Epic). Recommended to have repeat colon cnacer screening in 5 yrs, but it was normal.  Allergies  Allergen Reactions  . Codeine Nausea Only     Current Outpatient Medications:  .  Ascorbic Acid (VITAMIN C) 1000 MG tablet, Take 1,000 mg by mouth daily., Disp: , Rfl:  .  CALCIUM PO, Take by mouth., Disp: , Rfl:  .  Cholecalciferol 1000 UNITS capsule, Take 2 capsules by mouth daily., Disp: , Rfl:  .  lovastatin (MEVACOR) 40 MG tablet, TAKE 1 TABLET BY MOUTH AT BEDTIME, Disp: 30 tablet, Rfl: 10 .  metoprolol succinate (TOPROL-XL) 25 MG 24 hr tablet, Take 1 tablet (25 mg total) by mouth daily., Disp: 30 tablet, Rfl: 12 .  Multiple Vitamin (MULTIVITAMIN) tablet, Take 1 tablet by mouth daily., Disp: , Rfl:  .  nitrofurantoin, macrocrystal-monohydrate, (MACROBID) 100 MG capsule, Take 1 capsule (100 mg total) by mouth 2 (two) times daily., Disp:  14 capsule, Rfl: 0 .  senna (SENOKOT) 8.6 MG tablet, Take 1 tablet by mouth daily., Disp: , Rfl:  .  traZODone (DESYREL) 50 MG tablet, TAKE 1/2 TO 1 TABLET BY MOUTH AT BEDTIME AS NEEDED FOR SLEEP, Disp: 90 tablet, Rfl: 0 .  clobetasol ointment (TEMOVATE) 0.05 %, Apply to affected area two to three nights a week as needed. (Patient not taking: Reported on 11/04/2018), Disp: 30 g, Rfl: 12 .  hydrOXYzine (ATARAX/VISTARIL) 25 MG tablet,  Take 1 tablet (25 mg total) by mouth at bedtime. (Patient not taking: Reported on 11/04/2018), Disp: 30 tablet, Rfl: 12 .  nystatin ointment (MYCOSTATIN), Apply 1 application topically 2 (two) times daily. (Patient not taking: Reported on 05/24/2018), Disp: 60 g, Rfl: 2  Review of Systems  Constitutional: Negative.   Respiratory: Negative.   Cardiovascular: Negative.   Genitourinary: Negative.   Neurological: Negative.     Social History   Tobacco Use  . Smoking status: Never Smoker  . Smokeless tobacco: Never Used  Substance Use Topics  . Alcohol use: Yes    Alcohol/week: 0.0 standard drinks    Comment: occasionally drinks wine      Objective:   BP 133/74 (BP Location: Left Arm, Patient Position: Sitting, Cuff Size: Normal)   Pulse 73   Temp (!) 96.8 F (36 C) (Temporal)   Wt 154 lb 12.8 oz (70.2 kg)   SpO2 94%   BMI 30.23 kg/m  Vitals:   11/22/18 0846  BP: 133/74  Pulse: 73  Temp: (!) 96.8 F (36 C)  TempSrc: Temporal  SpO2: 94%  Weight: 154 lb 12.8 oz (70.2 kg)     Physical Exam Vitals signs reviewed.  Constitutional:      General: She is not in acute distress.    Appearance: Normal appearance. She is well-developed. She is not diaphoretic.  HENT:     Head: Normocephalic and atraumatic.  Eyes:     General: No scleral icterus.    Conjunctiva/sclera: Conjunctivae normal.  Neck:     Musculoskeletal: Neck supple.     Thyroid: No thyromegaly.  Cardiovascular:     Rate and Rhythm: Normal rate and regular rhythm.     Pulses: Normal pulses.     Heart sounds: Normal heart sounds. No murmur.  Pulmonary:     Effort: Pulmonary effort is normal. No respiratory distress.     Breath sounds: Normal breath sounds. No wheezing, rhonchi or rales.  Musculoskeletal:     Right lower leg: No edema.     Left lower leg: No edema.  Lymphadenopathy:     Cervical: No cervical adenopathy.  Skin:    General: Skin is warm and dry.     Capillary Refill: Capillary refill takes  less than 2 seconds.     Findings: No rash.  Neurological:     Mental Status: She is alert and oriented to person, place, and time. Mental status is at baseline.  Psychiatric:        Mood and Affect: Mood normal.        Behavior: Behavior normal.      No results found for any visits on 11/22/18.     Assessment & Plan   Problem List Items Addressed This Visit      Cardiovascular and Mediastinum   Benign essential HTN - Primary    Well controlled Continue current medications Recheck metabolic panel F/u in 6 months       Relevant Orders   Comprehensive metabolic panel  Other   Prediabetes    Discussed low carb diet Recheck a1c F/u in 6 months      Hypercholesterolemia without hypertriglyceridemia    Continue lovastatin Recheck CMP and FLP      Relevant Orders   Lipid panel   Comprehensive metabolic panel   Obesity    Discussed importance of healthy weight management Discussed diet and exercise        Other Visit Diagnoses    Incurved toenail       Relevant Orders   Ambulatory referral to Podiatry   Actinic keratoses       Relevant Orders   Ambulatory referral to Dermatology   Screen for colon cancer       Relevant Orders   Ambulatory referral to Gastroenterology     Patient to return for flu shot as she recently finished course of abx  Return in about 6 months (around 05/25/2019) for [place].   The entirety of the information documented in the History of Present Illness, Review of Systems and Physical Exam were personally obtained by me. Portions of this information were initially documented by [facility], CMA and reviewed by me for thoroughness and accuracy.    Bacigalupo, Dionne Bucy, MD MPH [facility]

## 2018-11-22 NOTE — Assessment & Plan Note (Signed)
Well controlled Continue current medications Recheck metabolic panel F/u in 6 months  

## 2018-11-23 LAB — COMPREHENSIVE METABOLIC PANEL
ALT: 26 IU/L (ref 0–32)
AST: 27 IU/L (ref 0–40)
Albumin/Globulin Ratio: 2.1 (ref 1.2–2.2)
Albumin: 4.5 g/dL (ref 3.8–4.8)
Alkaline Phosphatase: 87 IU/L (ref 39–117)
BUN/Creatinine Ratio: 12 (ref 12–28)
BUN: 9 mg/dL (ref 8–27)
Bilirubin Total: 0.7 mg/dL (ref 0.0–1.2)
CO2: 24 mmol/L (ref 20–29)
Calcium: 9.6 mg/dL (ref 8.7–10.3)
Chloride: 98 mmol/L (ref 96–106)
Creatinine, Ser: 0.76 mg/dL (ref 0.57–1.00)
GFR calc Af Amer: 95 mL/min/{1.73_m2} (ref >59)
GFR calc non Af Amer: 82 mL/min/{1.73_m2} (ref >59)
Globulin, Total: 2.1 g/dL (ref 1.5–4.5)
Glucose: 111 mg/dL — ABNORMAL HIGH (ref 65–99)
Potassium: 3.9 mmol/L (ref 3.5–5.2)
Sodium: 140 mmol/L (ref 134–144)
Total Protein: 6.6 g/dL (ref 6.0–8.5)

## 2018-11-23 LAB — LIPID PANEL
Chol/HDL Ratio: 3.6 ratio (ref 0.0–4.4)
Cholesterol, Total: 193 mg/dL (ref 100–199)
HDL: 54 mg/dL (ref >39)
LDL Calculated: 118 mg/dL — ABNORMAL HIGH (ref 0–99)
Triglycerides: 106 mg/dL (ref 0–149)
VLDL Cholesterol Cal: 21 mg/dL (ref 5–40)

## 2018-11-23 LAB — HEMOGLOBIN A1C
Est. average glucose Bld gHb Est-mCnc: 126 mg/dL
Hgb A1c MFr Bld: 6 % — ABNORMAL HIGH (ref 4.8–5.6)

## 2018-11-29 ENCOUNTER — Telehealth: Payer: Self-pay

## 2018-11-29 ENCOUNTER — Other Ambulatory Visit: Payer: Self-pay

## 2018-11-29 DIAGNOSIS — Z1211 Encounter for screening for malignant neoplasm of colon: Secondary | ICD-10-CM

## 2018-11-29 DIAGNOSIS — Z86010 Personal history of colonic polyps: Secondary | ICD-10-CM

## 2018-11-29 NOTE — Telephone Encounter (Signed)
Gastroenterology Pre-Procedure Review  Request Date: 12/16/18 Requesting Physician: Dr. Vicente Males  PATIENT REVIEW QUESTIONS: The patient responded to the following health history questions as indicated:    1. Are you having any GI issues? no 2. Do you have a personal history of Polyps? yes (unsure of the year) 3. Do you have a family history of Colon Cancer or Polyps? no 4. Diabetes Mellitus? no 5. Joint replacements in the past 12 months?no 6. Major health problems in the past 3 months?no 7. Any artificial heart valves, MVP, or defibrillator?no    MEDICATIONS & ALLERGIES:    Patient reports the following regarding taking any anticoagulation/antiplatelet therapy:   Plavix, Coumadin, Eliquis, Xarelto, Lovenox, Pradaxa, Brilinta, or Effient? no Aspirin? no  Patient confirms/reports the following medications:  Current Outpatient Medications  Medication Sig Dispense Refill  . Ascorbic Acid (VITAMIN C) 1000 MG tablet Take 1,000 mg by mouth daily.    Marland Kitchen CALCIUM PO Take by mouth.    . Cholecalciferol 1000 UNITS capsule Take 2 capsules by mouth daily.    . clobetasol ointment (TEMOVATE) 0.05 % Apply to affected area two to three nights a week as needed. (Patient not taking: Reported on 11/04/2018) 30 g 12  . hydrOXYzine (ATARAX/VISTARIL) 25 MG tablet Take 1 tablet (25 mg total) by mouth at bedtime. (Patient not taking: Reported on 11/04/2018) 30 tablet 12  . lovastatin (MEVACOR) 40 MG tablet TAKE 1 TABLET BY MOUTH AT BEDTIME 30 tablet 10  . metoprolol succinate (TOPROL-XL) 25 MG 24 hr tablet Take 1 tablet (25 mg total) by mouth daily. 30 tablet 12  . Multiple Vitamin (MULTIVITAMIN) tablet Take 1 tablet by mouth daily.    . nitrofurantoin, macrocrystal-monohydrate, (MACROBID) 100 MG capsule Take 1 capsule (100 mg total) by mouth 2 (two) times daily. 14 capsule 0  . nystatin ointment (MYCOSTATIN) Apply 1 application topically 2 (two) times daily. (Patient not taking: Reported on 05/24/2018) 60 g 2  .  senna (SENOKOT) 8.6 MG tablet Take 1 tablet by mouth daily.    . traZODone (DESYREL) 50 MG tablet TAKE 1/2 TO 1 TABLET BY MOUTH AT BEDTIME AS NEEDED FOR SLEEP 90 tablet 0   No current facility-administered medications for this visit.     Patient confirms/reports the following allergies:  Allergies  Allergen Reactions  . Codeine Nausea Only    No orders of the defined types were placed in this encounter.   AUTHORIZATION INFORMATION Primary Insurance: 1D#: Group #:  Secondary Insurance: 1D#: Group #:  SCHEDULE INFORMATION: Date: 12/16/18 Time: Location:[facility]

## 2018-12-13 ENCOUNTER — Other Ambulatory Visit: Payer: Self-pay

## 2018-12-13 ENCOUNTER — Other Ambulatory Visit
Admission: RE | Admit: 2018-12-13 | Discharge: 2018-12-13 | Disposition: A | Discharge status: Home / Self Care | Payer: PPO | Source: Ambulatory Visit | Attending: Gastroenterology | Admitting: Gastroenterology

## 2018-12-13 DIAGNOSIS — Z20828 Contact with and (suspected) exposure to other viral communicable diseases: Secondary | ICD-10-CM | POA: Diagnosis not present

## 2018-12-13 DIAGNOSIS — Z01812 Encounter for preprocedural laboratory examination: Secondary | ICD-10-CM | POA: Insufficient documentation

## 2018-12-13 MED ORDER — PEG 3350-KCL-NA BICARB-NACL 420 G PO SOLR: ORAL | 0 refills | Status: DC

## 2018-12-14 LAB — SARS CORONAVIRUS 2 (TAT 6-24 HRS): SARS Coronavirus 2: NEGATIVE

## 2018-12-15 ENCOUNTER — Encounter: Payer: Self-pay | Admitting: Anesthesiology

## 2018-12-16 ENCOUNTER — Ambulatory Visit
Admission: RE | Admit: 2018-12-16 | Discharge: 2018-12-16 | Disposition: A | Discharge status: Home / Self Care | Payer: PPO | Attending: Gastroenterology | Admitting: Gastroenterology

## 2018-12-16 ENCOUNTER — Encounter
Admission: RE | Disposition: A | Discharge status: Home / Self Care | Payer: Self-pay | Source: Home / Self Care | Attending: Gastroenterology

## 2018-12-16 ENCOUNTER — Ambulatory Visit: Payer: PPO | Admitting: Anesthesiology

## 2018-12-16 ENCOUNTER — Other Ambulatory Visit: Payer: Self-pay

## 2018-12-16 ENCOUNTER — Encounter: Payer: Self-pay | Admitting: *Deleted

## 2018-12-16 DIAGNOSIS — I10 Essential (primary) hypertension: Secondary | ICD-10-CM | POA: Diagnosis not present

## 2018-12-16 DIAGNOSIS — Z86010 Personal history of colonic polyps: Secondary | ICD-10-CM

## 2018-12-16 DIAGNOSIS — Z1211 Encounter for screening for malignant neoplasm of colon: Secondary | ICD-10-CM | POA: Diagnosis not present

## 2018-12-16 DIAGNOSIS — D123 Benign neoplasm of transverse colon: Secondary | ICD-10-CM | POA: Insufficient documentation

## 2018-12-16 DIAGNOSIS — Z79899 Other long term (current) drug therapy: Secondary | ICD-10-CM | POA: Insufficient documentation

## 2018-12-16 DIAGNOSIS — M1990 Unspecified osteoarthritis, unspecified site: Secondary | ICD-10-CM | POA: Insufficient documentation

## 2018-12-16 DIAGNOSIS — E7800 Pure hypercholesterolemia, unspecified: Secondary | ICD-10-CM | POA: Diagnosis not present

## 2018-12-16 DIAGNOSIS — K635 Polyp of colon: Secondary | ICD-10-CM | POA: Diagnosis not present

## 2018-12-16 HISTORY — PX: COLONOSCOPY WITH PROPOFOL: SHX5780

## 2018-12-16 SURGERY — COLONOSCOPY WITH PROPOFOL
Anesthesia: General

## 2018-12-16 MED ORDER — SODIUM CHLORIDE 0.9 % IV SOLN
INTRAVENOUS | Status: DC
  Administered 2018-12-16: 08:00:00 via INTRAVENOUS

## 2018-12-16 MED ORDER — PROPOFOL 10 MG/ML IV BOLUS
INTRAVENOUS | Status: DC | PRN
  Administered 2018-12-16: 70 mg via INTRAVENOUS

## 2018-12-16 MED ORDER — PROPOFOL 500 MG/50ML IV EMUL
INTRAVENOUS | Status: DC | PRN
  Administered 2018-12-16: 140 ug/kg/min via INTRAVENOUS

## 2018-12-16 NOTE — Op Note (Signed)
[facility] Gastroenterology Patient Name: Claudia Barnes Procedure Date: 12/16/2018 8:32 AM MRN: [id number] Account #: [id number] Date of Birth: Oct 03, 1950 Admit Type: Outpatient Age: 68 Room: [facility] ENDO ROOM 3 Gender: Female Note Status: Finalized Procedure:            Colonoscopy Indications:          High risk colon cancer surveillance: Personal history                        of colonic polyps Providers:            Jonathon Bellows MD, MD Referring MD:         Dionne Bucy. Bacigalupo (Referring MD) Medicines:            Monitored Anesthesia Care Complications:        No immediate complications. Procedure:            Pre-Anesthesia Assessment:                       - Prior to the procedure, a History and Physical was                        performed, and patient medications, allergies and                        sensitivities were reviewed. The patient's tolerance of                        previous anesthesia was reviewed.                       - The risks and benefits of the procedure and the                        sedation options and risks were discussed with the                        patient. All questions were answered and informed                        consent was obtained.                       - ASA Grade Assessment: II - A patient with mild                        systemic disease.                       After obtaining informed consent, the colonoscope was                        passed under direct vision. Throughout the procedure,                        the patient's blood pressure, pulse, and oxygen                        saturations were monitored continuously. The                        Colonoscope  was introduced through the anus and                        advanced to the the cecum, identified by the                        appendiceal orifice. The colonoscopy was performed with                        ease. The patient tolerated the procedure well. The                       quality of the bowel preparation was excellent. Findings:      The perianal and digital rectal examinations were normal.      A 7 mm polyp was found in the transverse colon. The polyp was sessile.       The polyp was removed with a cold snare. Resection and retrieval were       complete.      The exam was otherwise without abnormality on direct and retroflexion       views. Impression:           - One 7 mm polyp in the transverse colon, removed with                        a cold snare. Resected and retrieved.                       - The examination was otherwise normal on direct and                        retroflexion views. Recommendation:       - Discharge patient to home (with escort).                       - Resume previous diet.                       - Continue present medications.                       - Await pathology results.                       - Repeat colonoscopy in 5 years for surveillance. Procedure Code(s):    --- Professional ---                       [id number], Colonoscopy, flexible; with removal of tumor(s),                        polyp(s), or other lesion(s) by snare technique Diagnosis Code(s):    --- Professional ---                       Z86.010, Personal history of colonic polyps                       K63.5, Polyp of colon CPT copyright 2019 American Medical Association. All rights reserved. The codes documented in this report are preliminary and upon coder review may  be revised to meet current compliance requirements. Jonathon Bellows, MD Jonathon Bellows  MD, MD 12/16/2018 9:02:49 AM This report has been signed electronically. Number of Addenda: 0 Note Initiated On: 12/16/2018 8:32 AM Scope Withdrawal Time: 0 hours 18 minutes 37 seconds  Total Procedure Duration: 0 hours 26 minutes 24 seconds  Estimated Blood Loss: Estimated blood loss: none.      [place]

## 2018-12-16 NOTE — H&P (Addendum)
Jonathon Bellows, MD [address] [place], [place], [place], [address] [address], [place], [place], [place], [address] Phone: [phone]  Fax: [phone]  Primary Care Physician:  Virginia Crews, MD   Pre-Procedure History & Physical: HPI:  Claudia Barnes is a 68 y.o. female is here for an colonoscopy.   Past Medical History:  Diagnosis Date  . Hypercholesteremia   . Hypertension     Past Surgical History:  Procedure Laterality Date  . APPENDECTOMY  1970  . [place]    Prior to Admission medications   Medication Sig Start Date End Date Taking? Authorizing Provider  Ascorbic Acid (VITAMIN C) 1000 MG tablet Take 1,000 mg by mouth daily.   Yes [provider]  CALCIUM PO Take by mouth.   Yes [provider]  lovastatin (MEVACOR) 40 MG tablet TAKE 1 TABLET BY MOUTH AT BEDTIME 07/23/18  Yes Chrismon, Vickki Muff, PA  metoprolol succinate (TOPROL-XL) 25 MG 24 hr tablet Take 1 tablet (25 mg total) by mouth daily. 02/26/18  Yes Chrismon, Vickki Muff, PA  polyethylene glycol-electrolytes (NULYTELY/GOLYTELY) 420 g solution Drink one 8 oz glass every 20 mins until entire container is finished starting at 5:00pm on 12/15/18 12/13/18  Yes Jonathon Bellows, MD  traZODone (DESYREL) 50 MG tablet TAKE 1/2 TO 1 TABLET BY MOUTH AT BEDTIME AS NEEDED FOR SLEEP 09/24/18  Yes Chrismon, Vickki Muff, PA  Cholecalciferol 1000 UNITS capsule Take 2 capsules by mouth daily.    [provider]  clobetasol ointment (TEMOVATE) 0.05 % Apply to affected area two to three nights a week as needed. Patient not taking: Reported on 11/04/2018 04/26/18   Virginia Crews, MD  hydrOXYzine (ATARAX/VISTARIL) 25 MG tablet Take 1 tablet (25 mg total) by mouth at bedtime. Patient not taking: Reported on 11/04/2018 03/26/17   Homero Fellers, MD  Multiple Vitamin (MULTIVITAMIN) tablet Take 1 tablet by mouth daily.    [provider]  nitrofurantoin,  macrocrystal-monohydrate, (MACROBID) 100 MG capsule Take 1 capsule (100 mg total) by mouth 2 (two) times daily. Patient not taking: Reported on 12/16/2018 11/04/18   Chrismon, Vickki Muff, PA  nystatin ointment (MYCOSTATIN) Apply 1 application topically 2 (two) times daily. Patient not taking: Reported on 12/16/2018 01/21/18   Virginia Crews, MD  senna (SENOKOT) 8.6 MG tablet Take 1 tablet by mouth daily.    [provider]    Allergies as of 11/29/2018 - Review Complete 11/22/2018  Allergen Reaction Noted  . Codeine Nausea Only 09/28/2014    Family History  Problem Relation Age of Onset  . Congestive Heart Failure Father   . Hyperlipidemia Sister   . Hypertension Sister   . Hyperlipidemia Sister   . Hypertension Sister   . COPD Mother   . Depression Mother     Social History   Socioeconomic History  . Marital status: Divorced    Spouse name: Not on file  . Number of children: Not on file  . Years of education: Not on file  . Highest education level: Not on file  Occupational History  . Not on file  Social Needs  . Financial resource strain: Not on file  . Food insecurity    Worry: Not on file    Inability: Not on file  . Transportation needs    Medical: Not on file    Non-medical: Not on file  Tobacco Use  . Smoking status: Never Smoker  . Smokeless tobacco: Never Used  Substance and Sexual Activity  . Alcohol use: Yes    Alcohol/week: 0.0 standard drinks    Comment: occasionally drinks wine  . Drug use: No  . Sexual activity: Not on file  Lifestyle  . Physical activity    Days per week: Not on file    Minutes per session: Not on file  . Stress: Not on file  Relationships  . Social Herbalist on phone: Not on file    Gets together: Not on file    Attends religious service: Not on file    Active member of club or organization: Not on file    Attends meetings of clubs or organizations: Not on file    Relationship status: Not on file  .  Intimate partner violence    Fear of current or ex partner: Not on file    Emotionally abused: Not on file    Physically abused: Not on file    Forced sexual activity: Not on file  Other Topics Concern  . Not on file  Social History Narrative  . Not on file    Review of Systems: See HPI, otherwise negative ROS  Physical Exam: BP 138/63   Pulse 80   Temp (!) 97.4 F (36.3 C) (Tympanic)   Resp 16   Ht 5' (1.524 m)   Wt 68 kg   SpO2 99%   BMI 29.29 kg/m  General:   Alert,  pleasant and cooperative in NAD Head:  Normocephalic and atraumatic. Neck:  Supple; no masses or thyromegaly. Lungs:  Clear throughout to auscultation, normal respiratory effort.    Heart:  +S1, +S2, Regular rate and rhythm, No edema. Abdomen:  Soft, nontender and nondistended. Normal bowel sounds, without guarding, and without rebound.   Neurologic:  Alert and  oriented x4;  grossly normal neurologically.  Impression/Plan: Claudia Barnes is here for an colonoscopy to be performed for surveillance due to prior history of colon polyps . Last colonoscopy in 2008 showed polyps- unknown type.2013 showed no polyps  Risks, benefits, limitations, and alternatives regarding  colonoscopy have been reviewed with the patient.  Questions have been answered.  All parties agreeable.   Jonathon Bellows, MD  12/16/2018, 8:27 AM

## 2018-12-16 NOTE — Anesthesia Preprocedure Evaluation (Signed)
Anesthesia Evaluation  Patient identified by MRN, date of birth, ID band Patient awake    Reviewed: Allergy & Precautions, NPO status , Patient's Chart, lab work & pertinent test results, reviewed documented beta blocker date and time   Airway Mallampati: III  TM Distance: >3 FB     Dental  (+) Chipped   Pulmonary           Cardiovascular hypertension, Pt. on medications and Pt. on home beta blockers      Neuro/Psych PSYCHIATRIC DISORDERS Anxiety    GI/Hepatic   Endo/Other    Renal/GU      Musculoskeletal  (+) Arthritis ,   Abdominal   Peds  Hematology   Anesthesia Other Findings   Reproductive/Obstetrics                             Anesthesia Physical Anesthesia Plan  ASA: III  Anesthesia Plan: General   Post-op Pain Management:    Induction: Intravenous  PONV Risk Score and Plan:   Airway Management Planned:   Additional Equipment:   Intra-op Plan:   Post-operative Plan:   Informed Consent: I have reviewed the patients History and Physical, chart, labs and discussed the procedure including the risks, benefits and alternatives for the proposed anesthesia with the patient or authorized representative who has indicated his/her understanding and acceptance.       Plan Discussed with: CRNA  Anesthesia Plan Comments:         Anesthesia Quick Evaluation

## 2018-12-16 NOTE — Transfer of Care (Signed)
Immediate Anesthesia Transfer of Care Note  Patient: Claudia Barnes  Procedure(s) Performed: COLONOSCOPY WITH PROPOFOL (N/A )  Patient Location: PACU  Anesthesia Type:General  Level of Consciousness: sedated  Airway & Oxygen Therapy: Patient Spontanous Breathing and Patient connected to nasal cannula oxygen  Post-op Assessment: Report given to RN and Post -op Vital signs reviewed and stable  Post vital signs: Reviewed and stable  Last Vitals:  Vitals Value Taken Time  BP 135/77 12/16/18 0904  Temp 36.4 C 12/16/18 0904  Pulse 84 12/16/18 0904  Resp 16 12/16/18 0904  SpO2 94 % 12/16/18 0904  Vitals shown include unvalidated device data.  Last Pain:  Vitals:   12/16/18 0904  TempSrc:   PainSc: 0-No pain         Complications: No apparent anesthesia complications

## 2018-12-16 NOTE — Anesthesia Post-op Follow-up Note (Signed)
Anesthesia QCDR form completed.        

## 2018-12-16 NOTE — Anesthesia Postprocedure Evaluation (Signed)
Anesthesia Post Note  Patient: Claudia Barnes  Procedure(s) Performed: COLONOSCOPY WITH PROPOFOL (N/A )  Patient location during evaluation: Endoscopy Anesthesia Type: General Level of consciousness: awake and alert Pain management: pain level controlled Vital Signs Assessment: post-procedure vital signs reviewed and stable Respiratory status: spontaneous breathing, nonlabored ventilation, respiratory function stable and patient connected to nasal cannula oxygen Cardiovascular status: blood pressure returned to baseline and stable Postop Assessment: no apparent nausea or vomiting Anesthetic complications: no     Last Vitals:  Vitals:   12/16/18 0904 12/16/18 0933  BP: 135/77 132/66  Pulse: 83 75  Resp: 15 14  Temp: 36.4 C   SpO2: 95% 98%    Last Pain:  Vitals:   12/16/18 0904  TempSrc:   PainSc: 0-No pain                 Timoteo Carreiro S

## 2018-12-17 ENCOUNTER — Encounter: Payer: Self-pay | Admitting: Gastroenterology

## 2018-12-19 ENCOUNTER — Encounter: Payer: Self-pay | Admitting: Gastroenterology

## 2018-12-23 DIAGNOSIS — L400 Psoriasis vulgaris: Secondary | ICD-10-CM | POA: Diagnosis not present

## 2018-12-23 DIAGNOSIS — L821 Other seborrheic keratosis: Secondary | ICD-10-CM | POA: Diagnosis not present

## 2018-12-23 DIAGNOSIS — L570 Actinic keratosis: Secondary | ICD-10-CM | POA: Diagnosis not present

## 2018-12-23 DIAGNOSIS — L820 Inflamed seborrheic keratosis: Secondary | ICD-10-CM | POA: Diagnosis not present

## 2018-12-23 DIAGNOSIS — Z808 Family history of malignant neoplasm of other organs or systems: Secondary | ICD-10-CM | POA: Diagnosis not present

## 2018-12-31 LAB — SURGICAL PATHOLOGY

## 2019-01-25 ENCOUNTER — Other Ambulatory Visit: Payer: Self-pay | Admitting: Family Medicine

## 2019-01-25 DIAGNOSIS — G479 Sleep disorder, unspecified: Secondary | ICD-10-CM

## 2019-02-09 ENCOUNTER — Telehealth: Payer: Self-pay | Admitting: Family Medicine

## 2019-02-09 NOTE — Telephone Encounter (Signed)
We cannot provide any medical information about the patient to her sister.  We can however suggest that the sister reach out to Adult Protective Services through the county health department in which the patient lives to do a welfare/safety check and see if they have any resources to offer to the patient.  The sister can also talk to the patient about having a virtual or in person visit with me and the sister present.  If the patient says is okay to speak about this in front of the sister, we can all have a discussion and determine what resources we can offer and how we can help.  Counseling and therapy may also be helpful

## 2019-02-09 NOTE — Telephone Encounter (Signed)
Will need to check to see if her sister is on her DPR before discussing any of this with her.  CCM may be able to help with any community resources that may be available.  Patient may also benefit from therapy.  We can test for cognitive deficits, depression, anxiety that may be contributing at her next visit.

## 2019-02-09 NOTE — Telephone Encounter (Signed)
I checked patient's chart and asked Arbie Cookey about this. Patient has "NO ONE" listed on her DPR. Please advise on how we should proceed .

## 2019-02-09 NOTE — Telephone Encounter (Signed)
Patient's sister, Claudia Barnes, called stating she is concerned about Claudia Barnes and her living condition.  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  She will not throw away anything and everything is filthy.    Claudia Barnes wants to know where to start to help her.  A lady who cleans Claudia Barnes's home will not longer go into [place] home to try and clean  due to rat droppings. Claudia Barnes's # is [phone]

## 2019-02-10 NOTE — Telephone Encounter (Signed)
Spoke to Mrs Marcie Bal and Colletta [place] sister's about reaching out to Adult Protective services. Also advised that they talk directly to patient to set up an appointment. They have agreed to call Adult Protective Services. And call us if they need an appointment.

## 2019-02-17 DIAGNOSIS — L578 Other skin changes due to chronic exposure to nonionizing radiation: Secondary | ICD-10-CM | POA: Diagnosis not present

## 2019-02-17 DIAGNOSIS — L400 Psoriasis vulgaris: Secondary | ICD-10-CM | POA: Diagnosis not present

## 2019-02-17 DIAGNOSIS — L820 Inflamed seborrheic keratosis: Secondary | ICD-10-CM | POA: Diagnosis not present

## 2019-02-17 DIAGNOSIS — Z872 Personal history of diseases of the skin and subcutaneous tissue: Secondary | ICD-10-CM | POA: Diagnosis not present

## 2019-02-17 DIAGNOSIS — L821 Other seborrheic keratosis: Secondary | ICD-10-CM | POA: Diagnosis not present

## 2019-03-22 ENCOUNTER — Other Ambulatory Visit: Payer: Self-pay | Admitting: Family Medicine

## 2019-05-10 ENCOUNTER — Ambulatory Visit (INDEPENDENT_AMBULATORY_CARE_PROVIDER_SITE_OTHER): Payer: PPO | Admitting: Family Medicine

## 2019-05-10 ENCOUNTER — Other Ambulatory Visit: Payer: Self-pay

## 2019-05-10 NOTE — Progress Notes (Deleted)
Patient: Claudia Barnes, Female    DOB: [birth year], 69 y.o.   MRN: [id number] Visit Date: 05/10/2019  Today's Provider: Lavon Paganini, MD   Chief Complaint  Patient presents with  . Medicare Wellness   Subjective:       Hypertension, follow-up:  BP Readings from Last 3 Encounters:  12/16/18 132/66  11/22/18 133/74  05/24/18 134/78    She was last seen for hypertension 6 months ago.  BP at that visit was 133/74. Management changes since that visit include ***. She reports {excellent/good/fair/poor:19665} compliance with treatment. She {ACTION; IS/IS [id number] having side effects. *** She is not exercising. She is adherent to low salt diet.   Outside blood pressures are ***. She is experiencing {Symptoms; cardiac:12860}.  Patient denies {Symptoms; cardiac:12860}.   Cardiovascular risk factors include advanced age (older than 27 for men, 54 for women) and hypertension.  Use of agents associated with hypertension: none.     Weight trend: {trend:16658} Wt Readings from Last 3 Encounters:  12/16/18 150 lb (68 kg)  11/22/18 154 lb 12.8 oz (70.2 kg)  05/24/18 152 lb 3.2 oz (69 kg)    Current diet: {diet habits:16563}  ------------------------------------------------------------------------  Lipid/Cholesterol, Follow-up:   Last seen for this6 months ago.  Management changes since that visit include ***. Marland Kitchen Last Lipid Panel:    Component Value Date/Time   CHOL 193 11/22/2018 0933   TRIG 106 11/22/2018 0933   HDL 54 11/22/2018 0933   CHOLHDL 3.6 11/22/2018 0933   LDLCALC 118 (H) 11/22/2018 0933    Risk factors for vascular disease include {risk factors atherosclerosis:10337}  She reports {excellent/good/fair/poor:19665} compliance with treatment. She {ACTION; IS/IS [id number] having side effects.  Current symptoms include {Symptoms; diabetes:14075} and have been {Desc; course:15616}. Weight trend: {trend:16658} Prior visit with  dietician: {yes/no:17258} Current diet: {diet habits:16563} Current exercise: {exercise types:16438}  Wt Readings from Last 3 Encounters:  12/16/18 150 lb (68 kg)  11/22/18 154 lb 12.8 oz (70.2 kg)  05/24/18 152 lb 3.2 oz (69 kg)    -------------------------------------------------------------------  -----------------------------------------------------------   Review of Systems  Social History   Socioeconomic History  . Marital status: Divorced    Spouse name: Not on file  . Number of children: Not on file  . Years of education: Not on file  . Highest education level: Not on file  Occupational History  . Not on file  Tobacco Use  . Smoking status: Never Smoker  . Smokeless tobacco: Never Used  Substance and Sexual Activity  . Alcohol use: Yes    Alcohol/week: 0.0 standard drinks    Comment: occasionally drinks wine  . Drug use: No  . Sexual activity: Not on file  Other Topics Concern  . Not on file  Social History Narrative  . Not on file   Social Determinants of Health   Financial Resource Strain:   . Difficulty of Paying Living Expenses: Not on file  Food Insecurity:   . Worried About Charity fundraiser in the Last Year: Not on file  . Ran Out of Food in the Last Year: Not on file  Transportation Needs:   . Lack of Transportation (Medical): Not on file  . Lack of Transportation (Non-Medical): Not on file  Physical Activity:   . Days of Exercise per Week: Not on file  . Minutes of Exercise per Session: Not on file  Stress:   . Feeling of Stress : Not on file  Social Connections:   .  Frequency of Communication with Friends and Family: Not on file  . Frequency of Social Gatherings with Friends and Family: Not on file  . Attends Religious Services: Not on file  . Active Member of Clubs or Organizations: Not on file  . Attends Archivist Meetings: Not on file  . Marital Status: Not on file  Intimate Partner Violence:   . Fear of Current or  Ex-Partner: Not on file  . Emotionally Abused: Not on file  . Physically Abused: Not on file  . Sexually Abused: Not on file    Past Medical History:  Diagnosis Date  . Hypercholesteremia   . Hypertension      Patient Active Problem List   Diagnosis Date Noted  . Obesity 11/22/2018  . Lichen simplex chronicus 03/26/2017  . Psoriasis 04/14/2016  . Acute stress disorder 09/28/2014  . Primary osteoarthritis of right knee 09/28/2014  . Osteopenia 09/28/2014  . Pain in joint, lower leg 09/28/2014  . Avitaminosis D 08/14/2009  . Prediabetes 04/23/2009  . Cannot sleep 12/11/2008  . History of colon polyps 08/07/2008  . Benign essential HTN 01/13/2008  . Hypercholesterolemia without hypertriglyceridemia 01/13/2008    Past Surgical History:  Procedure Laterality Date  . APPENDECTOMY  1970  . CESAREAN SECTION  1981  . COLONOSCOPY WITH PROPOFOL N/A 12/16/2018   Procedure: COLONOSCOPY WITH PROPOFOL;  Surgeon: Jonathon Bellows, MD;  Location: [facility] ENDOSCOPY;  Service: Gastroenterology;  Laterality: N/A;    Her family history includes COPD in her mother; Congestive Heart Failure in her father; Depression in her mother; Hyperlipidemia in her sister and sister; Hypertension in her sister and sister.   Current Outpatient Medications:  .  Ascorbic Acid (VITAMIN C) 1000 MG tablet, Take 1,000 mg by mouth daily., Disp: , Rfl:  .  CALCIUM PO, Take by mouth., Disp: , Rfl:  .  Cholecalciferol 1000 UNITS capsule, Take 2 capsules by mouth daily., Disp: , Rfl:  .  clobetasol ointment (TEMOVATE) 0.05 %, Apply to affected area two to three nights a week as needed. (Patient not taking: Reported on 11/04/2018), Disp: 30 g, Rfl: 12 .  hydrOXYzine (ATARAX/VISTARIL) 25 MG tablet, Take 1 tablet (25 mg total) by mouth at bedtime. (Patient not taking: Reported on 11/04/2018), Disp: 30 tablet, Rfl: 12 .  lovastatin (MEVACOR) 40 MG tablet, TAKE 1 TABLET BY MOUTH AT BEDTIME, Disp: 30 tablet, Rfl: 10 .  metoprolol  succinate (TOPROL-XL) 25 MG 24 hr tablet, TAKE 1 TABLET BY MOUTH ONCE DAILY, Disp: 90 tablet, Rfl: 3 .  Multiple Vitamin (MULTIVITAMIN) tablet, Take 1 tablet by mouth daily., Disp: , Rfl:  .  nitrofurantoin, macrocrystal-monohydrate, (MACROBID) 100 MG capsule, Take 1 capsule (100 mg total) by mouth 2 (two) times daily. (Patient not taking: Reported on 12/16/2018), Disp: 14 capsule, Rfl: 0 .  nystatin ointment (MYCOSTATIN), Apply 1 application topically 2 (two) times daily. (Patient not taking: Reported on 12/16/2018), Disp: 60 g, Rfl: 2 .  polyethylene glycol-electrolytes (NULYTELY/GOLYTELY) 420 g solution, Drink one 8 oz glass every 20 mins until entire container is finished starting at 5:00pm on 12/15/18, Disp: 4000 mL, Rfl: 0 .  senna (SENOKOT) 8.6 MG tablet, Take 1 tablet by mouth daily., Disp: , Rfl:  .  traZODone (DESYREL) 50 MG tablet, TAKE 1/2 TO 1 TABLET BY MOUTH AT BEDTIME AS NEEDED FOR SLEEP, Disp: 90 tablet, Rfl: 1  Patient Care Team: Virginia Crews, MD as PCP - General (Family Medicine)     Objective:    Vitals:  There were no vitals taken for this visit.  Physical Exam  Activities of Daily Living No flowsheet data found.  Fall Risk Assessment Fall Risk  04/26/2018 05/07/2017  Falls in the past year? 0 No  Number falls in past yr: 0 -  Injury with Fall? 0 -     Depression Screen PHQ 2/9 Scores 04/26/2018 05/07/2017  PHQ - 2 Score 2 0  PHQ- 9 Score 7 5    No flowsheet data found.     Assessment & Plan:    Annual Physical Reviewed patient's Family Medical History Reviewed and updated list of patient's medical providers Assessment of cognitive impairment was done Assessed patient's functional ability Established a written schedule for health screening [place] Completed and Reviewed  Exercise Activities and Dietary recommendations Goals   None     Immunization History  Administered Date(s) Administered  . Influenza Split 03/20/2010,  04/02/2011, 02/05/2012  . Influenza, High Dose Seasonal PF 01/21/2018  . Influenza,inj,Quad PF,6+ Mos 12/30/2013, 04/12/2015, 12/14/2015, 01/22/2017  . Pneumococcal Conjugate-13 01/21/2018  . Td 03/07/2003  . Tdap 08/14/2009  . Zoster 04/12/2015    Health Maintenance  Topic Date Due  . MAMMOGRAM  06/04/2018  . INFLUENZA VACCINE  10/30/2018  . PNA vac Low Risk Adult (2 of 2 - PPSV23) 01/22/2019  . DEXA SCAN  06/09/2019  . TETANUS/TDAP  08/15/2019  . COLONOSCOPY  12/16/2023  . Hepatitis C Screening  Completed     Discussed health benefits of physical activity, and encouraged her to engage in regular exercise appropriate for her age and condition.    ------------------------------------------------------------------------------------------------------------    Lavon Paganini, MD  [place]

## 2019-05-10 NOTE — Progress Notes (Signed)
Patient arrived for visit, but reported cough.  Due to [id number] policy, her visit was rescheduled.

## 2019-05-24 ENCOUNTER — Other Ambulatory Visit: Payer: Self-pay | Admitting: Family Medicine

## 2019-05-25 ENCOUNTER — Encounter: Payer: Self-pay | Admitting: Family Medicine

## 2019-05-30 DIAGNOSIS — B351 Tinea unguium: Secondary | ICD-10-CM | POA: Diagnosis not present

## 2019-05-30 DIAGNOSIS — M2042 Other hammer toe(s) (acquired), left foot: Secondary | ICD-10-CM | POA: Diagnosis not present

## 2019-05-30 DIAGNOSIS — M2041 Other hammer toe(s) (acquired), right foot: Secondary | ICD-10-CM | POA: Diagnosis not present

## 2019-05-30 DIAGNOSIS — M79674 Pain in right toe(s): Secondary | ICD-10-CM | POA: Diagnosis not present

## 2019-05-30 DIAGNOSIS — M898X9 Other specified disorders of bone, unspecified site: Secondary | ICD-10-CM | POA: Diagnosis not present

## 2019-05-30 DIAGNOSIS — M2011 Hallux valgus (acquired), right foot: Secondary | ICD-10-CM | POA: Diagnosis not present

## 2019-06-09 ENCOUNTER — Other Ambulatory Visit: Payer: Self-pay | Admitting: Internal Medicine

## 2019-06-09 DIAGNOSIS — Z1231 Encounter for screening mammogram for malignant neoplasm of breast: Secondary | ICD-10-CM

## 2019-06-09 DIAGNOSIS — M25511 Pain in right shoulder: Secondary | ICD-10-CM | POA: Diagnosis not present

## 2019-06-09 DIAGNOSIS — Z0000 Encounter for general adult medical examination without abnormal findings: Secondary | ICD-10-CM

## 2019-06-09 DIAGNOSIS — R739 Hyperglycemia, unspecified: Secondary | ICD-10-CM | POA: Diagnosis not present

## 2019-06-09 DIAGNOSIS — I10 Essential (primary) hypertension: Secondary | ICD-10-CM | POA: Diagnosis not present

## 2019-06-09 DIAGNOSIS — E7800 Pure hypercholesterolemia, unspecified: Secondary | ICD-10-CM | POA: Diagnosis not present

## 2019-06-14 NOTE — Progress Notes (Signed)
Pt cancelled appt due to not feeling well and coughing. Advised pt to schedule a VV to f/u on cough. Pt declined. R/s AWV for 06/23/19 @ 10:40 AM. This encounter was created in error - please disregard.

## 2019-06-15 ENCOUNTER — Other Ambulatory Visit: Payer: Self-pay

## 2019-06-16 DIAGNOSIS — J209 Acute bronchitis, unspecified: Secondary | ICD-10-CM | POA: Diagnosis not present

## 2019-06-16 DIAGNOSIS — Z03818 Encounter for observation for suspected exposure to other biological agents ruled out: Secondary | ICD-10-CM | POA: Diagnosis not present

## 2019-06-22 NOTE — Progress Notes (Signed)
Subjective:   Claudia Barnes is a 69 y.o. female who presents for Medicare Annual (Subsequent) preventive examination.    This visit is being conducted through telemedicine due to the COVID-19 pandemic. This patient has given me verbal consent via doximity to conduct this visit, patient states they are participating from their home address. Some vital signs may be absent or patient reported.    Patient identification: identified by name, DOB, and current address  Review of Systems:  N/A  Cardiac Risk Factors include: advanced age (>40men, >75 women);dyslipidemia;hypertension     Objective:     Vitals: There were no vitals taken for this visit.  There is no height or weight on file to calculate BMI. Unable to obtain vitals due to visit being conducted via telephonically.   Advanced Directives 06/23/2019 12/16/2018  Does Patient Have a Medical Advance Directive? Yes No  Type of Paramedic of [place];Living will -  Copy of [place] in Chart? No - copy requested -    Tobacco Social History   Tobacco Use  Smoking Status Never Smoker  Smokeless Tobacco Never Used     Counseling given: Not Answered   Clinical Intake:  Pre-visit preparation completed: Yes  Pain : No/denies pain Pain Score: 0-No pain     Nutritional Risks: None Diabetes: No  How often do you need to have someone help you when you read instructions, pamphlets, or other written materials from your doctor or pharmacy?: 1 - Never  Interpreter Needed?: No  Information entered by :: [facility], LPN  Past Medical History:  Diagnosis Date  . Hypercholesteremia   . Hypertension    Past Surgical History:  Procedure Laterality Date  . APPENDECTOMY  1970  . CESAREAN SECTION  1981  . COLONOSCOPY WITH PROPOFOL N/A 12/16/2018   Procedure: COLONOSCOPY WITH PROPOFOL;  Surgeon: Jonathon Bellows, MD;  Location[facility] ENDOSCOPY;  Service: Gastroenterology;   Laterality: N/A;   Family History  Problem Relation Age of Onset  . Congestive Heart Failure Father   . Hyperlipidemia Sister   . Hypertension Sister   . Hyperlipidemia Sister   . Hypertension Sister   . COPD Mother   . Depression Mother    Social History   Socioeconomic History  . Marital status: Divorced    Spouse name: Not on file  . Number of children: 1  . Years of education: Not on file  . Highest education level: GED or equivalent  Occupational History  . Occupation: retired  Tobacco Use  . Smoking status: Never Smoker  . Smokeless tobacco: Never Used  Substance and Sexual Activity  . Alcohol use: Yes    Alcohol/week: 0.0 - 1.0 standard drinks  . Drug use: No  . Sexual activity: Not on file  Other Topics Concern  . Not on file  Social History Narrative  . Not on file   Social Determinants of Health   Financial Resource Strain: Low Risk   . Difficulty of Paying Living Expenses: Not hard at all  Food Insecurity: No Food Insecurity  . Worried About Charity fundraiser in the Last Year: Never true  . Ran Out of Food in the Last Year: Never true  Transportation Needs: No Transportation Needs  . Lack of Transportation (Medical): No  . Lack of Transportation (Non-Medical): No  Physical Activity: Inactive  . Days of Exercise per Week: 0 days  . Minutes of Exercise per Session: 0 min  Stress: Stress Concern Present  .  Feeling of Stress : To some extent  Social Connections: Moderately Isolated  . Frequency of Communication with Friends and Family: Once a week  . Frequency of Social Gatherings with Friends and Family: More than three times a week  . Attends Religious Services: Never  . Active Member of Clubs or Organizations: No  . Attends Archivist Meetings: Never  . Marital Status: Divorced    Outpatient Encounter Medications as of 06/23/2019  Medication Sig  . Ascorbic Acid (VITAMIN C) 1000 MG tablet Take 1,000 mg by mouth daily.  . benzonatate  (TESSALON) 200 MG capsule Take 200 mg by mouth 3 (three) times daily as needed.   Marland Kitchen CALCIUM PO Take by mouth daily.   . Cholecalciferol 1000 UNITS capsule Take 2 capsules by mouth daily.  Marland Kitchen doxycycline (VIBRA-TABS) 100 MG tablet Take 100 mg by mouth 2 (two) times daily.   Marland Kitchen lovastatin (MEVACOR) 40 MG tablet TAKE 1 TABLET BY MOUTH AT BEDTIME  . metoprolol succinate (TOPROL-XL) 25 MG 24 hr tablet TAKE 1 TABLET BY MOUTH ONCE DAILY  . Multiple Vitamin (MULTIVITAMIN) tablet Take 1 tablet by mouth daily.  . Multiple Vitamins-Minerals (AIRBORNE GUMMIES PO) Take by mouth daily.  Marland Kitchen senna (SENOKOT) 8.6 MG tablet Take 1 tablet by mouth daily. As needed  . traZODone (DESYREL) 50 MG tablet TAKE 1/2 TO 1 TABLET BY MOUTH AT BEDTIME AS NEEDED FOR SLEEP  . clobetasol ointment (TEMOVATE) 0.05 % Apply to affected area two to three nights a week as needed. (Patient not taking: Reported on 11/04/2018)  . hydrOXYzine (ATARAX/VISTARIL) 25 MG tablet Take 1 tablet (25 mg total) by mouth at bedtime. (Patient not taking: Reported on 11/04/2018)  . nitrofurantoin, macrocrystal-monohydrate, (MACROBID) 100 MG capsule Take 1 capsule (100 mg total) by mouth 2 (two) times daily. (Patient not taking: Reported on 12/16/2018)  . nystatin ointment (MYCOSTATIN) Apply 1 application topically 2 (two) times daily. (Patient not taking: Reported on 12/16/2018)  . polyethylene glycol-electrolytes (NULYTELY/GOLYTELY) 420 g solution Drink one 8 oz glass every 20 mins until entire container is finished starting at 5:00pm on 12/15/18 (Patient not taking: Reported on 06/23/2019)  . predniSONE (DELTASONE) 5 MG tablet 6 DAY TAPER, TAKE AS DIRECTED WITH FOOD   No facility-administered encounter medications on file as of 06/23/2019.    Activities of Daily Living In your present state of health, do you have any difficulty performing the following activities: 06/23/2019  Hearing? N  Vision? N  Difficulty concentrating or making decisions? N  Walking or  climbing stairs? Y  Comment Due to right knee pain  Dressing or bathing? N  Doing errands, shopping? N  Preparing Food and eating ? N  Using the Toilet? N  In the past six months, have you accidently leaked urine? Y  Comment Occasionally with pressure.  Do you have problems with loss of bowel control? N  Managing your Medications? N  Managing your Finances? N  Housekeeping or managing your Housekeeping? N  Some recent data might be hidden    Patient Care Team: Virginia Crews, MD as PCP - General (Family Medicine) Ralene Bathe, MD (Dermatology) Sharlotte Alamo, [place] (Podiatry) Jonathon Bellows, MD as Consulting Physician (Gastroenterology) Marry Guan Laurice Record, MD (Orthopedic Surgery)    Assessment:   This is a routine wellness examination for Paulita.  Exercise Activities and Dietary recommendations Current Exercise Habits: The patient does not participate in regular exercise at present, Exercise limited by: None identified  Goals    . Exercise  3x per week (30 min per time)     Recommend to start exercising 3 days a week for at least 30 minutes at a time.        Fall Risk: Fall Risk  06/23/2019 04/26/2018 05/07/2017  Falls in the past year? 0 0 No  Number falls in past yr: 0 0 -  Injury with Fall? 0 0 -    FALL RISK PREVENTION PERTAINING TO THE HOME:  Any stairs in or around the home? No  If so, are there any without handrails? N/A  Home free of loose throw rugs in walkways, pet beds, electrical cords, etc? Yes  Adequate lighting in your home to reduce risk of falls? Yes   ASSISTIVE DEVICES UTILIZED TO PREVENT FALLS:  Life alert? No  Use of a cane, walker or w/c? Yes  Grab bars in the bathroom? No  Shower chair or bench in shower? Yes  Elevated toilet seat or a handicapped toilet? No    TIMED UP AND GO:  Was the test performed? No .    Depression Screen PHQ 2/9 Scores 06/23/2019 04/26/2018 05/07/2017  PHQ - 2 Score 0 2 0  PHQ- 9 Score - 7 5     Cognitive  Function: Declined today due to not feeling well.        Immunization History  Administered Date(s) Administered  . Influenza Split 03/20/2010, 04/02/2011, 02/05/2012  . Influenza, High Dose Seasonal PF 01/21/2018  . Influenza,inj,Quad PF,6+ Mos 12/30/2013, 04/12/2015, 12/14/2015, 01/22/2017  . Pneumococcal Conjugate-13 01/21/2018  . Td 03/07/2003  . Tdap 08/14/2009  . Zoster 04/12/2015    Qualifies for Shingles Vaccine? Yes  Zostavax completed 04/12/15. Due for Shingrix. Pt has been advised to call insurance company to determine out of pocket expense. Advised may also receive vaccine at local pharmacy or Health Dept. Verbalized acceptance and understanding.  Tdap: Up to date  Flu Vaccine: Up to date  Pneumococcal Vaccine: Due for Pneumococcal vaccine. Does the patient want to receive this vaccine today?  No . Advised may receive this vaccine at local pharmacy or Health Dept. Aware to provide a copy of the vaccination record if obtained from local pharmacy or Health Dept. Verbalized acceptance and understanding.   Screening Tests Health Maintenance  Topic Date Due  . MAMMOGRAM  06/04/2018  . PNA vac Low Risk Adult (2 of 2 - PPSV23) 01/22/2019  . DEXA SCAN  06/09/2019  . INFLUENZA VACCINE  06/29/2019 (Originally 10/30/2018)  . TETANUS/TDAP  08/15/2019  . COLONOSCOPY  12/16/2023  . Hepatitis C Screening  Completed    Cancer Screenings:  Colorectal Screening: Completed 12/16/18. Repeat every 5 years.   Mammogram: Completed 06/03/16. Ordered 06/09/19. Pt plans to schedule an appointment along with DEXA scan this year.  Bone Density: Completed 06/08/17. Results reflect OSTEOPENIA. Repeat every 2 years. Ordered today. Pt aware the office will call re: appt.  Lung Cancer Screening: (Low Dose CT Chest recommended if Age 99-80 years, 30 pack-year currently smoking OR have quit w/in 15years.) does not qualify.   Additional Screening:  Hepatitis C Screening: Up to date  Vision  Screening: Recommended annual ophthalmology exams for early detection of glaucoma and other disorders of the eye.  Dental Screening: Recommended annual dental exams for proper oral hygiene  Community Resource Referral:  CRR required this visit?  No       Plan:  I have personally reviewed and addressed the Medicare Annual Wellness questionnaire and have noted the following in the patient's chart:  A. Medical and social history B. Use of alcohol, tobacco or illicit drugs  C. Current medications and supplements D. Functional ability and status E.  Nutritional status F.  Physical activity G. Advance directives H. List of other physicians I.  Hospitalizations, surgeries, and ER visits in previous 12 months J.  [place] such as hearing and vision if needed, cognitive and depression L. Referrals and appointments   In addition, I have reviewed and discussed with patient certain preventive protocols, quality metrics, and best practice recommendations. A written personalized care plan for preventive services as well as general preventive health recommendations were provided to patient. Nurse Health Advisor  Signed,    Cote Mayabb [place], [place]  [id number] Nurse Health Advisor   Nurse Notes: Pt would like to receive the Pneumovax 23 vaccine at next in office apt. Note made on apt. Ordered DEXA scan today. Pt plans to schedule mammogram with DEXA at [facility] this year.

## 2019-06-23 ENCOUNTER — Other Ambulatory Visit: Payer: Self-pay

## 2019-06-23 ENCOUNTER — Ambulatory Visit (INDEPENDENT_AMBULATORY_CARE_PROVIDER_SITE_OTHER): Payer: PPO

## 2019-06-23 DIAGNOSIS — Z0000 Encounter for general adult medical examination without abnormal findings: Secondary | ICD-10-CM

## 2019-06-23 DIAGNOSIS — E2839 Other primary ovarian failure: Secondary | ICD-10-CM

## 2019-06-23 NOTE — Patient Instructions (Signed)
Claudia Barnes , Thank you for taking time to come for your Medicare Wellness Visit. I appreciate your ongoing commitment to your health goals. Please review the following plan we discussed and let me know if I can assist you in the future.   Screening recommendations/referrals: Colonoscopy: Up to date, due 11/2023 Mammogram: Ordered 06/09/19. Pt plans to schedule this year. Bone Density: Ordered today. Pt aware the office will call re: appt. Recommended yearly ophthalmology/optometry visit for glaucoma screening and checkup Recommended yearly dental visit for hygiene and checkup  Vaccinations: Influenza vaccine: Pt declines today.  Pneumococcal vaccine: Pt declines today.  Tdap vaccine: Up to date Shingles vaccine: Pt declines today.     Advanced directives: Please bring a copy of your POA (Power of Attorney) and/or Living Will to your next appointment.   Conditions/risks identified: Recommend to start walking 3 days a week for at least 30 minutes at a time.   Next appointment: 07/12/19 @ 10:00 AM with Dr Brita Romp. Declined scheduling an AWV for 2022 at this time.    Preventive Care 77 Years and Older, Female Preventive care refers to lifestyle choices and visits with your health care provider that can promote health and wellness. What does preventive care include?  A yearly physical exam. This is also called an annual well check.  Dental exams once or twice a year.  Routine eye exams. Ask your health care provider how often you should have your eyes checked.  Personal lifestyle choices, including:  Daily care of your teeth and gums.  Regular physical activity.  Eating a healthy diet.  Avoiding tobacco and drug use.  Limiting alcohol use.  Practicing safe sex.  Taking low-dose aspirin every day.  Taking vitamin and mineral supplements as recommended by your health care provider. What happens during an annual well check? The services and screenings done by your health  care provider during your annual well check will depend on your age, overall health, lifestyle risk factors, and family history of disease. Counseling  Your health care provider may ask you questions about your:  Alcohol use.  Tobacco use.  Drug use.  Emotional well-being.  Home and relationship well-being.  Sexual activity.  Eating habits.  History of falls.  Memory and ability to understand (cognition).  Work and work Statistician.  Reproductive health. Screening  You may have the following tests or measurements:  Height, weight, and BMI.  Blood pressure.  Lipid and cholesterol levels. These may be checked every 5 years, or more frequently if you are over 26 years old.  Skin check.  Lung cancer screening. You may have this screening every year starting at age 45 if you have a 30-pack-year history of smoking and currently smoke or have quit within the past 15 years.  Fecal occult blood test (FOBT) of the stool. You may have this test every year starting at age 79.  Flexible sigmoidoscopy or colonoscopy. You may have a sigmoidoscopy every 5 years or a colonoscopy every 10 years starting at age 71.  Hepatitis C blood test.  Hepatitis B blood test.  Sexually transmitted disease (STD) testing.  Diabetes screening. This is done by checking your blood sugar (glucose) after you have not eaten for a while (fasting). You may have this done every 1-3 years.  Bone density scan. This is done to screen for osteoporosis. You may have this done starting at age 24.  Mammogram. This may be done every 1-2 years. Talk to your health care provider about how often  you should have regular mammograms. Talk with your health care provider about your test results, treatment options, and if necessary, the need for more tests. Vaccines  Your health care provider may recommend certain vaccines, such as:  Influenza vaccine. This is recommended every year.  Tetanus, diphtheria, and  acellular pertussis (Tdap, Td) vaccine. You may need a Td booster every 10 years.  Zoster vaccine. You may need this after age 81.  Pneumococcal 13-valent conjugate (PCV13) vaccine. One dose is recommended after age 37.  Pneumococcal polysaccharide (PPSV23) vaccine. One dose is recommended after age 49. Talk to your health care provider about which screenings and vaccines you need and how often you need them. This information is not intended to replace advice given to you by your health care provider. Make sure you discuss any questions you have with your health care provider. Document Released: 04/13/2015 Document Revised: 12/05/2015 Document Reviewed: 01/16/2015 Elsevier Interactive Patient Education  2017 [employer] Prevention in the Home Falls can cause injuries. They can happen to people of all ages. There are many things you can do to make your home safe and to help prevent falls. What can I do on the outside of my home?  Regularly fix the edges of walkways and driveways and fix any cracks.  Remove anything that might make you trip as you walk through a door, such as a raised step or threshold.  Trim any bushes or trees on the path to your home.  Use bright outdoor lighting.  Clear any walking paths of anything that might make someone trip, such as rocks or tools.  Regularly check to see if handrails are loose or broken. Make sure that both sides of any steps have handrails.  Any raised decks and porches should have guardrails on the edges.  Have any leaves, snow, or ice cleared regularly.  Use sand or salt on walking paths during winter.  Clean up any spills in your garage right away. This includes oil or grease spills. What can I do in the bathroom?  Use night lights.  Install grab bars by the toilet and in the tub and shower. Do not use towel bars as grab bars.  Use non-skid mats or decals in the tub or shower.  If you need to sit down in the shower, use  a plastic, non-slip stool.  Keep the floor dry. Clean up any water that spills on the floor as soon as it happens.  Remove soap buildup in the tub or shower regularly.  Attach bath mats securely with double-sided non-slip rug tape.  Do not have throw rugs and other things on the floor that can make you trip. What can I do in the bedroom?  Use night lights.  Make sure that you have a light by your bed that is easy to reach.  Do not use any sheets or blankets that are too big for your bed. They should not hang down onto the floor.  Have a firm chair that has side arms. You can use this for support while you get dressed.  Do not have throw rugs and other things on the floor that can make you trip. What can I do in the kitchen?  Clean up any spills right away.  Avoid walking on wet floors.  Keep items that you use a lot in easy-to-reach places.  If you need to reach something above you, use a strong step stool that has a grab bar.  Keep electrical cords  out of the way.  Do not use floor polish or wax that makes floors slippery. If you must use wax, use non-skid floor wax.  Do not have throw rugs and other things on the floor that can make you trip. What can I do with my stairs?  Do not leave any items on the stairs.  Make sure that there are handrails on both sides of the stairs and use them. Fix handrails that are broken or loose. Make sure that handrails are as long as the stairways.  Check any carpeting to make sure that it is firmly attached to the stairs. Fix any carpet that is loose or worn.  Avoid having throw rugs at the top or bottom of the stairs. If you do have throw rugs, attach them to the floor with carpet tape.  Make sure that you have a light switch at the top of the stairs and the bottom of the stairs. If you do not have them, ask someone to add them for you. What else can I do to help prevent falls?  Wear shoes that:  Do not have high heels.  Have  rubber bottoms.  Are comfortable and fit you well.  Are closed at the toe. Do not wear sandals.  If you use a stepladder:  Make sure that it is fully opened. Do not climb a closed stepladder.  Make sure that both sides of the stepladder are locked into place.  Ask someone to hold it for you, if possible.  Clearly mark and make sure that you can see:  Any grab bars or handrails.  First and last steps.  Where the edge of each step is.  Use tools that help you move around (mobility aids) if they are needed. These include:  Canes.  Walkers.  Scooters.  Crutches.  Turn on the lights when you go into a dark area. Replace any light bulbs as soon as they burn out.  Set up your furniture so you have a clear path. Avoid moving your furniture around.  If any of your floors are uneven, fix them.  If there are any pets around you, be aware of where they are.  Review your medicines with your doctor. Some medicines can make you feel dizzy. This can increase your chance of falling. Ask your doctor what other things that you can do to help prevent falls. This information is not intended to replace advice given to you by your health care provider. Make sure you discuss any questions you have with your health care provider. Document Released: 01/11/2009 Document Revised: 08/23/2015 Document Reviewed: 04/21/2014 Elsevier Interactive Patient Education  2017 [employer].

## 2019-07-12 ENCOUNTER — Encounter: Payer: PPO | Admitting: Family Medicine

## 2019-07-14 ENCOUNTER — Other Ambulatory Visit: Payer: PPO

## 2019-07-23 ENCOUNTER — Other Ambulatory Visit: Payer: Self-pay | Admitting: Family Medicine

## 2019-07-23 DIAGNOSIS — G479 Sleep disorder, unspecified: Secondary | ICD-10-CM

## 2019-08-17 ENCOUNTER — Ambulatory Visit: Payer: PPO | Admitting: Dermatology

## 2019-08-21 ENCOUNTER — Other Ambulatory Visit: Payer: Self-pay | Admitting: Family Medicine

## 2019-08-21 NOTE — Telephone Encounter (Signed)
Requested Prescriptions  Pending Prescriptions Disp Refills  . lovastatin (MEVACOR) 40 MG tablet [Pharmacy Med Name: LOVASTATIN 40MG  TABLETS] 90 tablet 0    Sig: TAKE 1 TABLET BY MOUTH AT BEDTIME     Cardiovascular:  Antilipid - Statins Failed - 08/21/2019 10:15 AM      Failed - LDL in normal range and within 360 days    LDL Calculated  Date Value Ref Range Status  11/22/2018 118 (H) 0 - 99 mg/dL Final    Comment:    **Effective November 29, 2018, [facility] is implementing an improved** equation to calculate Low Density Lipoprotein Cholesterol (LDL-C) concentrations, to be used in all lipid panels that report calculated LDL-C. This equation was developed through a collaboration with the [employer], Lung and [place] (NIH).[1] The NIH calculation overcomes the limitations of the existing Friedewald LDL-C equation and performs equally well in both fasting and non-fasting individuals. 1. Pauline Good Q, et al. A new equation for calculation of low-density lipoprotein cholesterol in patients with normolipidemia and/or hypertriglyceridemia. JAMA Cardiol. 2020 Feb 26. doi:10.1001/jamacardio.2020.0013          Passed - Total Cholesterol in normal range and within 360 days    Cholesterol, Total  Date Value Ref Range Status  11/22/2018 193 100 - 199 mg/dL Final         Passed - HDL in normal range and within 360 days    HDL  Date Value Ref Range Status  11/22/2018 54 >39 mg/dL Final         Passed - Triglycerides in normal range and within 360 days    Triglycerides  Date Value Ref Range Status  11/22/2018 106 0 - 149 mg/dL Final         Passed - Patient is not pregnant      Passed - Valid encounter within last 12 months    Recent Outpatient Visits          3 months ago Erroneous encounter - disregard   [facility] [place], Dionne Bucy, MD   9 months ago Benign essential HTN   [place], Dionne Bucy, MD   9  months ago [place], [place], [place]   1 year ago Benign essential HTN   [facility] [place], Dionne Bucy, MD   1 year ago Medicare welcome exam   [facility] [place], Dionne Bucy, MD

## 2019-10-20 ENCOUNTER — Telehealth: Payer: Self-pay

## 2019-10-20 NOTE — Telephone Encounter (Signed)
Copied from [place] [id number]. Topic: Appointment Scheduling - Scheduling Inquiry for Clinic >> Oct 20, 2019 10:34 AM Rainey Pines A wrote: Patient wants to be seen in office for arm pain and a continuous "tickle in her throat". Patient wants to get an in office appointment with [facility] approved. Please advise

## 2019-10-21 NOTE — Telephone Encounter (Signed)
Called patient and no answer, left voicemail message for patient to return call. If patient calls back okay for [name] to advise patient she has to schedule a virtual visit.

## 2019-10-21 NOTE — Telephone Encounter (Signed)
Virtual, thanks.

## 2019-10-24 NOTE — Telephone Encounter (Signed)
Phone call to pt.  Advised that due to her c/o tickle in throat, a Virtual appt. Has been recommended by provider.  Pt. Verb. Understanding.  Appt. Sched. 10/25/19 @ 3:00 PM.  Stated she doesn't know much about her smart phone, and will need some guidance to do the Virtual appt.  Advised that the office staff will contact her ahead of the appt. Time and provide direction.  Pt. Agreed with plan.

## 2019-10-24 NOTE — Progress Notes (Deleted)
MyChart Video Visit    Virtual Visit via Video Note   This visit type was conducted due to national recommendations for restrictions regarding the COVID-19 Pandemic (e.g. social distancing) in an effort to limit this patient's exposure and mitigate transmission in our community. This patient is at least at moderate risk for complications without adequate follow up. This format is felt to be most appropriate for this patient at this time. Physical exam was limited by quality of the video and audio technology used for the visit.   Patient location: *** Provider location: ***   Patient: Claudia Barnes   DOB: Apr 04, 1950   69 y.o. Female  MRN: [id number] Visit Date: 10/25/2019  Today's healthcare provider: Trinna Post, PA-C   No chief complaint on file.  Subjective    Back Pain      {Show patient history (optional):23778::" "}  Medications: Outpatient Medications Prior to Visit  Medication Sig  . Ascorbic Acid (VITAMIN C) 1000 MG tablet Take 1,000 mg by mouth daily.  Marland Kitchen CALCIUM PO Take by mouth daily.   . Cholecalciferol 1000 UNITS capsule Take 2 capsules by mouth daily.  . clobetasol ointment (TEMOVATE) 0.05 % Apply to affected area two to three nights a week as needed. (Patient not taking: Reported on 11/04/2018)  . hydrOXYzine (ATARAX/VISTARIL) 25 MG tablet Take 1 tablet (25 mg total) by mouth at bedtime. (Patient not taking: Reported on 11/04/2018)  . lovastatin (MEVACOR) 40 MG tablet TAKE 1 TABLET BY MOUTH AT BEDTIME  . metoprolol succinate (TOPROL-XL) 25 MG 24 hr tablet TAKE 1 TABLET BY MOUTH ONCE DAILY  . Multiple Vitamin (MULTIVITAMIN) tablet Take 1 tablet by mouth daily.  . Multiple Vitamins-Minerals (AIRBORNE GUMMIES PO) Take by mouth daily.  . nitrofurantoin, macrocrystal-monohydrate, (MACROBID) 100 MG capsule Take 1 capsule (100 mg total) by mouth 2 (two) times daily. (Patient not taking: Reported on 12/16/2018)  . nystatin ointment (MYCOSTATIN) Apply 1  application topically 2 (two) times daily. (Patient not taking: Reported on 12/16/2018)  . polyethylene glycol-electrolytes (NULYTELY/GOLYTELY) 420 g solution Drink one 8 oz glass every 20 mins until entire container is finished starting at 5:00pm on 12/15/18 (Patient not taking: Reported on 06/23/2019)  . predniSONE (DELTASONE) 5 MG tablet 6 DAY TAPER, TAKE AS DIRECTED WITH FOOD  . senna (SENOKOT) 8.6 MG tablet Take 1 tablet by mouth daily. As needed  . traZODone (DESYREL) 50 MG tablet TAKE 1/2 TO 1 TABLET BY MOUTH AT BEDTIME AS NEEDED FOR SLEEP   No facility-administered medications prior to visit.    Review of Systems  Musculoskeletal: Positive for back pain.    {Heme  Chem  Endocrine  Serology  Results Review (optional):23779::" "}  Objective    There were no vitals taken for this visit. {Show previous vital signs (optional):23777::" "}  Physical Exam     Assessment & Plan     ***  No follow-ups on file.     I discussed the assessment and treatment plan with the patient. The patient was provided an opportunity to ask questions and all were answered. The patient agreed with the plan and demonstrated an understanding of the instructions.   The patient was advised to call back or seek an in-person evaluation if the symptoms worsen or if the condition fails to improve as anticipated.  I provided *** minutes of non-face-to-face time during this encounter.  {provider attestation***:1}  Paulene Floor [facility] [phone] (phone) [phone] (fax)  [place]

## 2019-10-25 ENCOUNTER — Telehealth: Payer: PPO | Admitting: Physician Assistant

## 2019-10-25 NOTE — Telephone Encounter (Signed)
Called Ms Newby to help with her virtual visit.  Pt states she is going to go to Urgent Care instead.  I advised her I will cancel her appointment.    Thanks,   -Mickel Baas

## 2019-11-21 ENCOUNTER — Telehealth: Payer: Self-pay | Admitting: Family Medicine

## 2019-11-21 MED ORDER — LOVASTATIN 40 MG PO TABS: 40.0000 mg | ORAL_TABLET | Freq: Every day | ORAL | 0 refills | Status: AC

## 2019-11-21 NOTE — Telephone Encounter (Signed)
[facility] faxed refill request for the following medications:   lovastatin (MEVACOR) 40 MG tablet     Please advise.  

## 2019-12-12 DIAGNOSIS — I10 Essential (primary) hypertension: Secondary | ICD-10-CM | POA: Diagnosis not present

## 2019-12-12 DIAGNOSIS — Z23 Encounter for immunization: Secondary | ICD-10-CM | POA: Diagnosis not present

## 2019-12-12 DIAGNOSIS — E7800 Pure hypercholesterolemia, unspecified: Secondary | ICD-10-CM | POA: Diagnosis not present

## 2019-12-12 DIAGNOSIS — R739 Hyperglycemia, unspecified: Secondary | ICD-10-CM | POA: Diagnosis not present

## 2020-01-09 DIAGNOSIS — R300 Dysuria: Secondary | ICD-10-CM | POA: Diagnosis not present

## 2020-01-09 DIAGNOSIS — E7800 Pure hypercholesterolemia, unspecified: Secondary | ICD-10-CM | POA: Diagnosis not present

## 2020-01-09 DIAGNOSIS — R8290 Unspecified abnormal findings in urine: Secondary | ICD-10-CM | POA: Diagnosis not present

## 2020-01-11 ENCOUNTER — Ambulatory Visit: Payer: Self-pay

## 2020-01-11 ENCOUNTER — Encounter: Payer: Self-pay | Admitting: Adult Health

## 2020-01-11 ENCOUNTER — Ambulatory Visit (INDEPENDENT_AMBULATORY_CARE_PROVIDER_SITE_OTHER): Payer: PPO | Admitting: Adult Health

## 2020-01-11 ENCOUNTER — Other Ambulatory Visit: Payer: Self-pay

## 2020-01-11 VITALS — BP 133/56 | HR 65 | Temp 98.2°F | Resp 16 | Wt 146.0 lb

## 2020-01-11 DIAGNOSIS — T148XXA Other injury of unspecified body region, initial encounter: Secondary | ICD-10-CM | POA: Diagnosis not present

## 2020-01-11 DIAGNOSIS — W540XXA Bitten by dog, initial encounter: Secondary | ICD-10-CM | POA: Diagnosis not present

## 2020-01-11 DIAGNOSIS — S81831A Puncture wound without foreign body, right lower leg, initial encounter: Secondary | ICD-10-CM

## 2020-01-11 DIAGNOSIS — Z23 Encounter for immunization: Secondary | ICD-10-CM | POA: Diagnosis not present

## 2020-01-11 NOTE — Patient Instructions (Addendum)
https://www.cdc.gov/vaccines/hcp/vis/vis-statements/tdap.pdf">  Tdap (Tetanus, Diphtheria, Pertussis) Vaccine: What You Need to Know 1. Why get vaccinated? Tdap vaccine can prevent tetanus, diphtheria, and pertussis. Diphtheria and pertussis spread from person to person. Tetanus enters the body through cuts or wounds.  TETANUS (T) causes painful stiffening of the muscles. Tetanus can lead to serious health problems, including being unable to open the mouth, having trouble swallowing and breathing, or death.  DIPHTHERIA (D) can lead to difficulty breathing, heart failure, paralysis, or death.  PERTUSSIS (aP), also known as "whooping cough," can cause uncontrollable, violent coughing which makes it hard to breathe, eat, or drink. Pertussis can be extremely serious in babies and young children, causing pneumonia, convulsions, brain damage, or death. In teens and adults, it can cause weight loss, loss of bladder control, passing out, and rib fractures from severe coughing. 2. Tdap vaccine Tdap is only for children 7 years and older, adolescents, and adults.  Adolescents should receive a single dose of Tdap, preferably at age 70 or 2 years. Pregnant women should get a dose of Tdap during every pregnancy, to protect the newborn from pertussis. Infants are most at risk for severe, life-threatening complications from pertussis. Adults who have never received Tdap should get a dose of Tdap. Also, adults should receive a booster dose every 10 years, or earlier in the case of a severe and dirty wound or burn. Booster doses can be either Tdap or Td (a different vaccine that protects against tetanus and diphtheria but not pertussis). Tdap may be given at the same time as other vaccines. 3. Talk with your health care provider Tell your vaccine provider if the person getting the vaccine:  Has had an allergic reaction after a previous dose of any vaccine that protects against tetanus, diphtheria,  or pertussis, or has any severe, life-threatening allergies.  Has had a coma, decreased level of consciousness, or prolonged seizures within 7 days after a previous dose of any pertussis vaccine (DTP, DTaP, or Tdap).  Has seizures or another nervous system problem.  Has ever had Guillain-Barr Syndrome (also called GBS).  Has had severe pain or swelling after a previous dose of any vaccine that protects against tetanus or diphtheria. In some cases, your health care provider may decide to postpone Tdap vaccination to a future visit.  People with minor illnesses, such as a cold, may be vaccinated. People who are moderately or severely ill should usually wait until they recover before getting Tdap vaccine.  Your health care provider can give you more information. 4. Risks of a vaccine reaction  Pain, redness, or swelling where the shot was given, mild fever, headache, feeling tired, and nausea, vomiting, diarrhea, or stomachache sometimes happen after Tdap vaccine. People sometimes faint after medical procedures, including vaccination. Tell your provider if you feel dizzy or have vision changes or ringing in the ears.  As with any medicine, there is a very remote chance of a vaccine causing a severe allergic reaction, other serious injury, or death. 5. What if there is a serious problem? An allergic reaction could occur after the vaccinated person leaves the clinic. If you see signs of a severe allergic reaction (hives, swelling of the face and throat, difficulty breathing, a fast heartbeat, dizziness, or weakness), call 9-1-1 and get the person to the nearest hospital. For other signs that concern you, call your health care provider.  Adverse reactions should be reported to the Vaccine Adverse Event Reporting System (VAERS). Your health care  provider will usually file this report, or you can do it yourself. Visit the VAERS website at www.vaers.SamedayNews.es or call 253-566-8805. VAERS is only for  reporting reactions, and VAERS staff do not give medical advice. 6. The National Vaccine Injury Compensation Program The [employer] Vaccine Injury Compensation Program (VICP) is a federal program that was created to compensate people who may have been injured by certain vaccines. Visit the VICP website at GoldCloset.com.ee or call 334-416-0335 to learn about the program and about filing a claim. There is a time limit to file a claim for compensation. 7. How can I learn more?  Ask your health care provider.  Call your local or state health department.  Contact the Centers for Disease Control and Prevention (CDC): ? Call (234) 458-1853 (1-800-CDC-INFO) or ? Visit CDC's website at http://hunter.com/ Vaccine Information Statement Tdap (Tetanus, Diphtheria, Pertussis) Vaccine (06/30/2018) This information is not intended to replace advice given to you by your health care provider. Make sure you discuss any questions you have with your health care provider. Document Revised: 07/09/2018 Document Reviewed: 07/12/2018 Elsevier Patient Education  [place], Adult Animal bite wounds can be mild or serious. It is important to get medical treatment to prevent infection. Ask your doctor if you need treatment to prevent an infection that can spread from animals to humans (rabies). Follow these instructions at home: Wound care   Follow instructions from your doctor about how to take care of your wound. Make sure you: ? Wash your hands with soap and water before you change your bandage (dressing). If you cannot use soap and water, use hand sanitizer. ? Change your bandage as told by your doctor. ? Leave stitches (sutures), skin glue, or skin tape (adhesive) strips in place. They may need to stay in place for 2 weeks or longer. If tape strips get loose and curl up, you may trim the loose edges. Do not remove tape strips completely unless your doctor says it is  okay.  Check your wound every day for signs of infection. Check for: ? More redness, swelling, or pain. ? More fluid or blood. ? Warmth. ? Pus or a bad smell. Medicines  Take or apply over-the-counter and prescription medicines only as told by your doctor.  If you were prescribed an antibiotic, take or apply it as told by your doctor. Do not stop using the antibiotic even if your wound gets better. General instructions   Keep the injured area raised (elevated) above the level of your heart while you are sitting or lying down.  If directed, put ice on the injured area. ? Put ice in a plastic bag. ? Place a towel between your skin and the bag. ? Leave the ice on for 20 minutes, 2-3 times per day.  Keep all follow-up visits as told by your doctor. This is important. Contact a doctor if:  You have more redness, swelling, or pain around your wound.  Your wound feels warm to the touch.  You have a fever or chills.  You have a general feeling of sickness (malaise).  You feel sick to your stomach (nauseous).  You throw up (vomit).  You have pain that does not get better. Get help right away if:  You have a red streak going away from your wound.  You have any of these coming from your wound: ? Non-clear fluid. ? More blood. ? Pus or a bad smell.  You have trouble moving your injured area.  You lose feeling (  have numbness) or feel tingling anywhere on your body. Summary  It is important to get the right medical treatment for animal bites. Treatment can help you to not get an infection. Ask your doctor if you need treatment to prevent an infection that can spread from animals to humans (rabies).  Check your wound every day for signs of infection, such as more redness or swelling instead of less.  If you have a red streak going away from your wound, get medical help right away. This information is not intended to replace advice given to you by your health care provider.  Make sure you discuss any questions you have with your health care provider. Document Revised: 03/12/2017 Document Reviewed: 09/25/2016 Elsevier Patient Education  [place].

## 2020-01-11 NOTE — Telephone Encounter (Signed)
Patient called stating that her family dog got mad at seeing another dog and bit her in the rt leg.today.  She states that there is a welted up area the size of a fifty cent.  She states that there are two open areas one is larger that the other and continues to bleed.  She has treated with neosporin.  She had her last tetanus vaccine Tdap 2011. She has called her Animal nutritionist and they states that her dogs rabies is up to date. Per protocol patient was scheduled appointment.  Care advice read to patient.  She states she can not wash the area as instructed that it would hurt to much.    Reason for Disposition . [1] Last tetanus shot > 5 years ago AND [2] any wound (e.g., cut, scrape)  Answer Assessment - Initial Assessment Questions 1. ANIMAL: "What type of animal caused the bite?" "Is the injury from a bite or a claw?" If the animal is a dog or a cat, ask: "Was it a pet or a stray?" "Was it acting ill or behaving strangely?"     Dog family pet 2. LOCATION: "Where is the bite located?"      Rt leg 3. SIZE: "How big is the bite?" "What does it look like?"    welp 50 cent piece 4. ONSET: "When did the bite happen?" (Minutes or hours ago)      today 5. CIRCUMSTANCES: "Tell me how this happened."     Mad up to date on rabes 6. TETANUS: "When was the last tetanus booster?"     2011 7. PREGNANCY: "Is there any chance you are pregnant?" "When was your last menstrual period?"     N/A  Protocols used: ANIMAL BITE-A-AH

## 2020-01-11 NOTE — Progress Notes (Signed)
Established patient visit   Patient: Claudia Barnes   DOB: 09-Apr-1950   69 y.o. Female  MRN: [id number] Visit Date: 01/11/2020  Today's healthcare provider: Marcille Buffy, FNP   Chief Complaint  Patient presents with  . Animal Bite   Subjective    Animal Bite  The incident occurred today. The incident occurred at home. She came to the ER via personal transport. There is an injury to the right thigh. Pertinent negatives include no inability to bear weight, no neck pain and no pain when bearing weight. There have been no prior injuries to these areas. Her tetanus status is out of date.    Last Tdap 08/14/2009  She reports her small sized dog bit her before lunch today around 11am . She says her dog does not like her sisters dog who was present at the time of the incident, bite was unprovoked per patient.. Her dog is up to daye on all shots including rabies and sees the vet regularly per patient.   Patient was bitten on her right lower thigh has two small teeth marks she reports. Denies any pain. Denies any other bites or any one else being bitten.   She is prediabetic.  She is currently on macrodantin for UTI treated by Dr. Ouida Sills and does not wish to switch to alternative antibiotic therapy.  No blood thinners. Hemostasis has been achieved prior to visit.   Patient  denies any fever, body aches,chills, rash, chest pain, shortness of breath, nausea, vomiting, or diarrhea. Denies dizziness, lightheadedness, pre syncopal or syncopal episodes.    Patient Active Problem List   Diagnosis Date Noted  . Dog bite 01/11/2020  . Obesity 11/22/2018  . Lichen simplex chronicus 03/26/2017  . Psoriasis 04/14/2016  . Acute stress disorder 09/28/2014  . Primary osteoarthritis of right knee 09/28/2014  . Osteopenia 09/28/2014  . Pain in joint, lower leg 09/28/2014  . Avitaminosis D 08/14/2009  . Prediabetes 04/23/2009  . Cannot sleep 12/11/2008  . History of colon  polyps 08/07/2008  . Benign essential HTN 01/13/2008  . Hypercholesterolemia without hypertriglyceridemia 01/13/2008   Past Medical History:  Diagnosis Date  . Hypercholesteremia   . Hypertension    Past Surgical History:  Procedure Laterality Date  . APPENDECTOMY  1970  . CESAREAN SECTION  1981  . COLONOSCOPY WITH PROPOFOL N/A 12/16/2018   Procedure: COLONOSCOPY WITH PROPOFOL;  Surgeon: Jonathon Bellows, MD;  Location: [facility] ENDOSCOPY;  Service: Gastroenterology;  Laterality: N/A;   Social History   Tobacco Use  . Smoking status: Never Smoker  . Smokeless tobacco: Never Used  Vaping Use  . Vaping Use: Never used  Substance Use Topics  . Alcohol use: Yes    Alcohol/week: 0.0 - 1.0 standard drinks  . Drug use: No   Social History   Socioeconomic History  . Marital status: Divorced    Spouse name: Not on file  . Number of children: 1  . Years of education: Not on file  . Highest education level: GED or equivalent  Occupational History  . Occupation: retired  Tobacco Use  . Smoking status: Never Smoker  . Smokeless tobacco: Never Used  Vaping Use  . Vaping Use: Never used  Substance and Sexual Activity  . Alcohol use: Yes    Alcohol/week: 0.0 - 1.0 standard drinks  . Drug use: No  . Sexual activity: Not on file  Other Topics Concern  . Not on file  Social History Narrative  . Not  on file   Social Determinants of Health   Financial Resource Strain: Low Risk   . Difficulty of Paying Living Expenses: Not hard at all  Food Insecurity: No Food Insecurity  . Worried About Charity fundraiser in the Last Year: Never true  . Ran Out of Food in the Last Year: Never true  Transportation Needs: No Transportation Needs  . Lack of Transportation (Medical): No  . Lack of Transportation (Non-Medical): No  Physical Activity: Inactive  . Days of Exercise per Week: 0 days  . Minutes of Exercise per Session: 0 min  Stress: Stress Concern Present  . Feeling of Stress : To some  extent  Social Connections: Socially Isolated  . Frequency of Communication with Friends and Family: Once a week  . Frequency of Social Gatherings with Friends and Family: More than three times a week  . Attends Religious Services: Never  . Active Member of Clubs or Organizations: No  . Attends Archivist Meetings: Never  . Marital Status: Divorced  Human resources officer Violence: Not At Risk  . Fear of Current or Ex-Partner: No  . Emotionally Abused: No  . Physically Abused: No  . Sexually Abused: No   Family Status  Relation Name Status  . Father  Deceased at age 42  . Sister 1 Alive  . Sister 2 Alive  . Mother  (Not Specified)   Family History  Problem Relation Age of Onset  . Congestive Heart Failure Father   . Hyperlipidemia Sister   . Hypertension Sister   . Hyperlipidemia Sister   . Hypertension Sister   . COPD Mother   . Depression Mother    Allergies  Allergen Reactions  . Codeine Nausea Only       Medications: Outpatient Medications Prior to Visit  Medication Sig  . Ascorbic Acid (VITAMIN C) 1000 MG tablet Take 1,000 mg by mouth daily.  Marland Kitchen CALCIUM PO Take by mouth daily.   . Cholecalciferol 1000 UNITS capsule Take 2 capsules by mouth daily.  Marland Kitchen lovastatin (MEVACOR) 40 MG tablet Take 1 tablet (40 mg total) by mouth at bedtime. Please schedule office visit before any future refills.  . metoprolol succinate (TOPROL-XL) 25 MG 24 hr tablet TAKE 1 TABLET BY MOUTH ONCE DAILY  . Multiple Vitamin (MULTIVITAMIN) tablet Take 1 tablet by mouth daily.  . Multiple Vitamins-Minerals (AIR[place]NE GUMMIES PO) Take by mouth daily.  . nitrofurantoin, macrocrystal-monohydrate, (MACROBID) 100 MG capsule Take 1 capsule (100 mg total) by mouth 2 (two) times daily.  Marland Kitchen nystatin ointment (MYCOSTATIN) Apply 1 application topically 2 (two) times daily.  . polyethylene glycol-electrolytes (NULYTELY/GOLYTELY) 420 g solution Drink one 8 oz glass every 20 mins until entire container is  finished starting at 5:00pm on 12/15/18  . predniSONE (DELTASONE) 5 MG tablet 6 DAY TAPER, TAKE AS DIRECTED WITH FOOD  . senna (SENOKOT) 8.6 MG tablet Take 1 tablet by mouth daily. As needed  . traZODone (DESYREL) 100 MG tablet Take 100 mg by mouth at bedtime.  . clobetasol ointment (TEMOVATE) 0.05 % Apply to affected area two to three nights a week as needed. (Patient not taking: Reported on 11/04/2018)  . hydrOXYzine (ATARAX/VISTARIL) 25 MG tablet Take 1 tablet (25 mg total) by mouth at bedtime. (Patient not taking: Reported on 11/04/2018)  . [DISCONTINUED] traZODone (DESYREL) 50 MG tablet TAKE 1/2 TO 1 TABLET BY MOUTH AT BEDTIME AS NEEDED FOR SLEEP   No facility-administered medications prior to visit.    Review of Systems  Constitutional: Negative.   HENT: Negative.   Respiratory: Negative.   Cardiovascular: Negative.   Gastrointestinal: Negative.   Musculoskeletal: Negative for neck pain.  Skin: Positive for wound.  Psychiatric/Behavioral: Negative.     Last CBC Lab Results  Component Value Date   WBC 6.0 05/07/2017   HGB 13.5 05/07/2017   HCT 39.8 05/07/2017   MCV 85 05/07/2017   MCH 29.0 05/07/2017   RDW 14.3 05/07/2017   PLT 227 36/64/4034   Last metabolic panel Lab Results  Component Value Date   GLUCOSE 111 (H) 11/22/2018   NA 140 11/22/2018   K 3.9 11/22/2018   CL 98 11/22/2018   CO2 24 11/22/2018   BUN 9 11/22/2018   CREATININE 0.76 11/22/2018   GFRNONAA 82 11/22/2018   GFRAA 95 11/22/2018   CALCIUM 9.6 11/22/2018   PROT 6.6 11/22/2018   ALBUMIN 4.5 11/22/2018   LABGLOB 2.1 11/22/2018   AGRATIO 2.1 11/22/2018   BILITOT 0.7 11/22/2018   ALKPHOS 87 11/22/2018   AST 27 11/22/2018   ALT 26 11/22/2018   Last lipids Lab Results  Component Value Date   CHOL 193 11/22/2018   HDL 54 11/22/2018   LDLCALC 118 (H) 11/22/2018   TRIG 106 11/22/2018   CHOLHDL 3.6 11/22/2018   Last hemoglobin A1c Lab Results  Component Value Date   HGBA1C 6.0 (H) 11/22/2018     Last thyroid functions Lab Results  Component Value Date   TSH 0.727 05/07/2017   Last vitamin D No results found for: 25OHVITD2, 25OHVITD3, VD25OH Last vitamin B12 and Folate No results found for: VITAMINB12, FOLATE    Objective    BP (!) 133/56   Pulse 65   Temp 98.2 F (36.8 C) (Oral)   Resp 16   Wt 146 lb (66.2 kg)   BMI 28.51 kg/m  BP Readings from Last 3 Encounters:  01/11/20 (!) 133/56  12/16/18 132/66  11/22/18 133/74      Physical Exam Vitals reviewed.  Constitutional:      General: She is not in acute distress.    Appearance: Normal appearance. She is not ill-appearing, toxic-appearing or diaphoretic.  HENT:     Head: Normocephalic and atraumatic.     Right Ear: External ear normal.     Left Ear: External ear normal.     Nose: Nose normal.     Mouth/Throat:     Pharynx: No oropharyngeal exudate.  Eyes:     Conjunctiva/sclera: Conjunctivae normal.  Cardiovascular:     Rate and Rhythm: Normal rate and regular rhythm.     Pulses: Normal pulses.          Popliteal pulses are 2+ on the right side and 2+ on the left side.       Posterior tibial pulses are 2+ on the right side and 2+ on the left side.     Heart sounds: Normal heart sounds.  Pulmonary:     Effort: Pulmonary effort is normal.     Breath sounds: Normal breath sounds.  Abdominal:     Palpations: Abdomen is soft.  Musculoskeletal:     Cervical back: Normal range of motion and neck supple.  Lymphadenopathy:     Cervical: No cervical adenopathy.  Skin:    Findings: Signs of injury present.       Neurological:     General: No focal deficit present.     Mental Status: She is alert.     Motor: No weakness.     Gait:  Gait normal.  Psychiatric:        Mood and Affect: Mood normal.        Behavior: Behavior normal.        Thought Content: Thought content normal.        Judgment: Judgment normal.      No results found for any visits on 01/11/20.  Assessment & Plan     The primary  encounter diagnosis was Dog bite, initial encounter. A diagnosis of Superficial wound due to dog bite was also pertinent to this visit. Dogs rabies and immunizations up to date and current including rabies, patient endorses dog sees veterinarian regularly and is not sick and has not recently been injured or bitten. No rabies prophylaxis advised given this.   TDAP given at office visit 01/11/2020   Recommend Augmentin for three to five days to cover bite wound for infection prophylaxis since she is prediabetic-  patient declined.  Wound care discussed. Signs of infection and when to return were discussed.   She then reports at end of visit she is no longer seeing her Virginia Crews, MD in this practice and has switched to Dr. Ouida Sills stating " I did not get appropriate care here as yall made me do a video visit and I knew I did not have covid so I left to see Dr. Ouida Sills as my primary care".  Explained this was office protocol with covid.   Red Flags discussed. The patient was given clear instructions to go to ER or return to medical center if any red flags develop, symptoms do not improve, worsen or new problems develop. They verbalized understanding.  Return in about 1 week (around 01/18/2020), or if symptoms worsen or fail to improve, for at any time for any worsening symptoms, Go to Emergency room/ urgent care if worse.      Addressed acute and or chronic medical problems today requiring 23 minutes reviewing patients medical record,labs, counseling patient regarding patient's conditions, any medications, answering questions regarding health, and coordination of care as needed. After visit summary patient given copy and reviewed.   Marcille Buffy, [place] [phone] (phone) [phone] (fax)  [place]

## 2020-01-12 NOTE — Telephone Encounter (Signed)
[name] needs to be made aware that people with new PCPs in other offices should not be scheduled here for acute visits.  She has many notes and messages in her chart for [name] for new PCP. Thanks!

## 2020-06-12 DIAGNOSIS — I10 Essential (primary) hypertension: Secondary | ICD-10-CM | POA: Diagnosis not present

## 2020-06-12 DIAGNOSIS — Z0000 Encounter for general adult medical examination without abnormal findings: Secondary | ICD-10-CM | POA: Diagnosis not present

## 2020-06-12 DIAGNOSIS — Z1231 Encounter for screening mammogram for malignant neoplasm of breast: Secondary | ICD-10-CM | POA: Diagnosis not present

## 2020-06-12 DIAGNOSIS — R7303 Prediabetes: Secondary | ICD-10-CM | POA: Diagnosis not present

## 2020-06-12 DIAGNOSIS — E7800 Pure hypercholesterolemia, unspecified: Secondary | ICD-10-CM | POA: Diagnosis not present

## 2020-10-13 DIAGNOSIS — Z20822 Contact with and (suspected) exposure to covid-19: Secondary | ICD-10-CM | POA: Diagnosis not present

## 2020-10-13 DIAGNOSIS — R0981 Nasal congestion: Secondary | ICD-10-CM | POA: Diagnosis not present

## 2020-10-13 DIAGNOSIS — R059 Cough, unspecified: Secondary | ICD-10-CM | POA: Diagnosis not present

## 2020-10-13 DIAGNOSIS — U071 COVID-19: Secondary | ICD-10-CM | POA: Diagnosis not present

## 2020-12-13 DIAGNOSIS — I10 Essential (primary) hypertension: Secondary | ICD-10-CM | POA: Diagnosis not present

## 2020-12-13 DIAGNOSIS — E7800 Pure hypercholesterolemia, unspecified: Secondary | ICD-10-CM | POA: Diagnosis not present

## 2020-12-13 DIAGNOSIS — R399 Unspecified symptoms and signs involving the genitourinary system: Secondary | ICD-10-CM | POA: Diagnosis not present

## 2020-12-13 DIAGNOSIS — Z23 Encounter for immunization: Secondary | ICD-10-CM | POA: Diagnosis not present

## 2020-12-13 DIAGNOSIS — R7303 Prediabetes: Secondary | ICD-10-CM | POA: Diagnosis not present

## 2021-04-18 DIAGNOSIS — N390 Urinary tract infection, site not specified: Secondary | ICD-10-CM | POA: Diagnosis not present

## 2021-04-18 DIAGNOSIS — R399 Unspecified symptoms and signs involving the genitourinary system: Secondary | ICD-10-CM | POA: Diagnosis not present

## 2021-06-25 ENCOUNTER — Other Ambulatory Visit: Payer: Self-pay | Admitting: Internal Medicine

## 2021-06-25 DIAGNOSIS — I10 Essential (primary) hypertension: Secondary | ICD-10-CM | POA: Diagnosis not present

## 2021-06-25 DIAGNOSIS — R7303 Prediabetes: Secondary | ICD-10-CM | POA: Diagnosis not present

## 2021-06-25 DIAGNOSIS — N390 Urinary tract infection, site not specified: Secondary | ICD-10-CM | POA: Diagnosis not present

## 2021-06-25 DIAGNOSIS — E7800 Pure hypercholesterolemia, unspecified: Secondary | ICD-10-CM | POA: Diagnosis not present

## 2021-06-25 DIAGNOSIS — Z1231 Encounter for screening mammogram for malignant neoplasm of breast: Secondary | ICD-10-CM

## 2021-06-25 DIAGNOSIS — Z0000 Encounter for general adult medical examination without abnormal findings: Secondary | ICD-10-CM | POA: Diagnosis not present

## 2021-06-27 ENCOUNTER — Encounter: Payer: Self-pay | Admitting: Urology

## 2021-06-27 ENCOUNTER — Ambulatory Visit (INDEPENDENT_AMBULATORY_CARE_PROVIDER_SITE_OTHER): Payer: PPO | Admitting: Urology

## 2021-06-27 VITALS — BP 119/74 | HR 65 | Ht 60.0 in | Wt 146.0 lb

## 2021-06-27 DIAGNOSIS — N390 Urinary tract infection, site not specified: Secondary | ICD-10-CM

## 2021-06-27 LAB — MICROSCOPIC EXAMINATION
RBC, Urine: NONE SEEN /hpf (ref 0–2)
WBC, UA: >30 /hpf — AB (ref 0–5)

## 2021-06-27 LAB — URINALYSIS, COMPLETE
Bilirubin, UA: NEGATIVE
Glucose, UA: NEGATIVE
Ketones, UA: NEGATIVE
Nitrite, UA: NEGATIVE
Protein,UA: NEGATIVE
RBC, UA: NEGATIVE
Specific Gravity, UA: 1.015 (ref 1.005–1.030)
Urobilinogen, Ur: 0.2 mg/dL (ref 0.2–1.0)
pH, UA: 7 (ref 5.0–7.5)

## 2021-06-27 LAB — BLADDER SCAN AMB NON-IMAGING: Scan Result: 60

## 2021-06-27 NOTE — Patient Instructions (Signed)
Take over the counter D-Mannose and Cranberry tablets daily 

## 2021-06-27 NOTE — Progress Notes (Signed)
? ?06/27/2021 ?3:03 PM  ? ?Claudia Barnes ?05-14-1950 ?[id number] ? ?Referring provider: Kirk Ruths, MD ?[place],  [place] [address] ? ?Chief Complaint  ?Patient presents with  ? Urinary Tract Infection  ? ? ?HPI: ?Claudia Barnes is a 71 y.o. female referred for evaluation of recurrent UTIs. ? ?She states she has been treated for 2 UTIs in the last 6 months ?Record review with a positive urine cultures January 2023 and September 2022 for E. coli.  ?States her typical symptoms are dysuria and urgency ?Presently asymptomatic ?No prior gross hematuria, febrile UTIs ?Denies flank, abdominal or pelvic pain ? ?PMH: ?Past Medical History:  ?Diagnosis Date  ? Hypercholesteremia   ? Hypertension   ? ? ?Surgical History: ?Past Surgical History:  ?Procedure Laterality Date  ? APPENDECTOMY  1970  ? [place]  ? COLONOSCOPY WITH PROPOFOL N/A 12/16/2018  ? Procedure: COLONOSCOPY WITH PROPOFOL;  Surgeon: Jonathon Bellows, MD;  Location: [facility] ENDOSCOPY;  Service: Gastroenterology;  Laterality: N/A;  ? ? ?Home Medications:  ?Allergies as of 06/27/2021   ? ?   Reactions  ? Codeine Nausea Only  ? ?  ? ?  ?Medication List  ?  ? ?  ? Accurate as of June 27, 2021  3:03 PM. If you have any questions, ask your nurse or doctor.  ?  ?  ? ?  ? ?STOP taking these medications   ? ?nitrofurantoin (macrocrystal-monohydrate) 100 MG capsule ?Commonly known as: MACROBID ?Stopped by: Abbie Sons, MD ?  ?nystatin ointment ?Commonly known as: MYCOSTATIN ?Stopped by: Abbie Sons, MD ?  ?polyethylene glycol-electrolytes 420 g solution ?Commonly known as: NuLYTELY ?Stopped by: Abbie Sons, MD ?  ?predniSONE 5 MG tablet ?Commonly known as: DELTASONE ?Stopped by: Abbie Sons, MD ?  ?traZODone 100 MG tablet ?Commonly known as: DESYREL ?Stopped by: Abbie Sons, MD ?  ? ?  ? ?TAKE these medications   ? ?AIRBORNE GUMMIES PO ?Take by mouth daily. ?  ?CALCIUM  PO ?Take by mouth daily. ?  ?Cholecalciferol 25 MCG (1000 UT) capsule ?Take 2 capsules by mouth daily. ?  ?clobetasol ointment 0.05 % ?Commonly known as: TEMOVATE ?Apply to affected area two to three nights a week as needed. ?  ?hydrOXYzine 25 MG tablet ?Commonly known as: ATARAX ?Take 1 tablet (25 mg total) by mouth at bedtime. ?  ?lovastatin 40 MG tablet ?Commonly known as: MEVACOR ?Take 1 tablet (40 mg total) by mouth at bedtime. Please schedule office visit before any future refills. ?  ?metoprolol succinate 25 MG 24 hr tablet ?Commonly known as: TOPROL-XL ?TAKE 1 TABLET BY MOUTH ONCE DAILY ?  ?multivitamin tablet ?Take 1 tablet by mouth daily. ?  ?senna 8.6 MG tablet ?Commonly known as: SENOKOT ?Take 1 tablet by mouth daily. As needed ?  ?vitamin C 1000 MG tablet ?Take 1,000 mg by mouth daily. ?  ? ?  ? ? ?Allergies:  ?Allergies  ?Allergen Reactions  ? Codeine Nausea Only  ? ? ?Family History: ?Family History  ?Problem Relation Age of Onset  ? Congestive Heart Failure Father   ? Hyperlipidemia Sister   ? Hypertension Sister   ? Hyperlipidemia Sister   ? Hypertension Sister   ? COPD Mother   ? Depression Mother   ? ? ?Social History:  reports that she has never smoked. She has never used smokeless tobacco. She reports current alcohol use. She reports that she  does not use drugs. ? ? ?Physical Exam: ?BP 119/74   Pulse 65   Ht 5' (1.524 m)   Wt 146 lb (66.2 kg)   BMI 28.51 kg/m?   ?Constitutional:  Alert and oriented, No acute distress. ?HEENT: [place] AT, moist mucus membranes.  Trachea midline, no masses. ?Cardiovascular: No clubbing, cyanosis, or edema. ?Respiratory: Normal respiratory effort, no increased work of breathing. ?Skin: No rashes, bruises or suspicious lesions. ?Neurologic: Grossly intact, no focal deficits, moving all 4 extremities. ?Psychiatric: Normal mood and affect. ? ?Laboratory Data: ? ?Urinalysis ?Dipstick 2+ leukocytes ?Microscopy >30 WBC, few bacteria ? ? ?Assessment & Plan:   ? ?1.   Recurrent UTI ?We discussed the evaluation and treatment of patients with recurrent UTIs at length.  We specifically discussed the differences between asymptomatic bacteriuria and true urinary tract infection.  We discussed the AUA definition of recurrent UTI of at least 2 culture proven symptomatic acute cystitis episodes in a 63-monthperiod, or 3 within a 1 year period.  We discussed the importance of culture directed antibiotic treatment, and antibiotic stewardship.  First-line therapy includes nitrofurantoin(5 days), Bactrim(3 days), or fosfomycin(3 g single dose).  Possible etiologies of recurrent infection include periurethral tissue atrophy in postmenopausal woman, constipation, sexual activity, incomplete emptying, anatomic abnormalities, and even genetic predisposition.  Finally, we discussed the role of perineal hygiene, timed voiding, adequate hydration, topical vaginal estrogen, cranberry prophylaxis, and low-dose antibiotic prophylaxis. ?Urinalysis today with pyuria though she is asymptomatic ?Would not recommend treatment for asymptomatic bacteriuria.  A urine culture was ordered and will treat if she develops symptoms ?Bladder scan PVR 60 mL ? ? ? ?SAbbie Sons MD ? ?[place]?[address] ?[place] [address]?[phone]? ?

## 2021-06-28 ENCOUNTER — Encounter: Payer: Self-pay | Admitting: Urology

## 2021-07-01 LAB — CULTURE, URINE COMPREHENSIVE

## 2021-07-04 DIAGNOSIS — R399 Unspecified symptoms and signs involving the genitourinary system: Secondary | ICD-10-CM | POA: Diagnosis not present

## 2021-08-02 ENCOUNTER — Ambulatory Visit
Admission: RE | Admit: 2021-08-02 | Discharge: 2021-08-02 | Disposition: A | Discharge status: Home / Self Care | Payer: PPO | Source: Ambulatory Visit | Attending: Internal Medicine | Admitting: Internal Medicine

## 2021-08-02 DIAGNOSIS — Z1231 Encounter for screening mammogram for malignant neoplasm of breast: Secondary | ICD-10-CM | POA: Diagnosis not present

## 2021-09-17 DIAGNOSIS — H1131 Conjunctival hemorrhage, right eye: Secondary | ICD-10-CM | POA: Diagnosis not present

## 2021-09-17 DIAGNOSIS — H5203 Hypermetropia, bilateral: Secondary | ICD-10-CM | POA: Diagnosis not present

## 2021-11-25 DIAGNOSIS — M25561 Pain in right knee: Secondary | ICD-10-CM | POA: Diagnosis not present

## 2021-11-25 DIAGNOSIS — M13861 Other specified arthritis, right knee: Secondary | ICD-10-CM | POA: Diagnosis not present

## 2021-12-26 DIAGNOSIS — E7800 Pure hypercholesterolemia, unspecified: Secondary | ICD-10-CM | POA: Diagnosis not present

## 2021-12-26 DIAGNOSIS — Z23 Encounter for immunization: Secondary | ICD-10-CM | POA: Diagnosis not present

## 2021-12-26 DIAGNOSIS — I10 Essential (primary) hypertension: Secondary | ICD-10-CM | POA: Diagnosis not present

## 2021-12-26 DIAGNOSIS — R7303 Prediabetes: Secondary | ICD-10-CM | POA: Diagnosis not present

## 2022-06-26 ENCOUNTER — Other Ambulatory Visit: Payer: Self-pay | Admitting: Internal Medicine

## 2022-06-26 DIAGNOSIS — Z1231 Encounter for screening mammogram for malignant neoplasm of breast: Secondary | ICD-10-CM

## 2022-07-02 DIAGNOSIS — E7800 Pure hypercholesterolemia, unspecified: Secondary | ICD-10-CM | POA: Diagnosis not present

## 2022-07-02 DIAGNOSIS — R7303 Prediabetes: Secondary | ICD-10-CM | POA: Diagnosis not present

## 2022-07-02 DIAGNOSIS — R300 Dysuria: Secondary | ICD-10-CM | POA: Diagnosis not present

## 2022-07-02 DIAGNOSIS — Z0000 Encounter for general adult medical examination without abnormal findings: Secondary | ICD-10-CM | POA: Diagnosis not present

## 2022-07-02 DIAGNOSIS — I10 Essential (primary) hypertension: Secondary | ICD-10-CM | POA: Diagnosis not present

## 2022-08-04 ENCOUNTER — Ambulatory Visit
Admission: RE | Admit: 2022-08-04 | Discharge: 2022-08-04 | Disposition: A | Discharge status: Home / Self Care | Payer: HMO | Source: Ambulatory Visit | Attending: Internal Medicine | Admitting: Internal Medicine

## 2022-08-04 DIAGNOSIS — Z1231 Encounter for screening mammogram for malignant neoplasm of breast: Secondary | ICD-10-CM | POA: Diagnosis not present

## 2022-09-10 IMAGING — MG MM DIGITAL SCREENING BILAT W/ TOMO AND CAD
6 of 10 series · 6 of 30 positions shown · non-contrast
Comparison: Previous exam(s).

CLINICAL DATA: Screening.

EXAM:
DIGITAL SCREENING BILATERAL MAMMOGRAM WITH TOMOSYNTHESIS AND CAD
TECHNIQUE: Bilateral screening digital craniocaudal and mediolateral oblique
mammograms were obtained. Bilateral screening digital breast
tomosynthesis was performed. The images were evaluated with
computer-aided detection.

[L CC synth-2D]
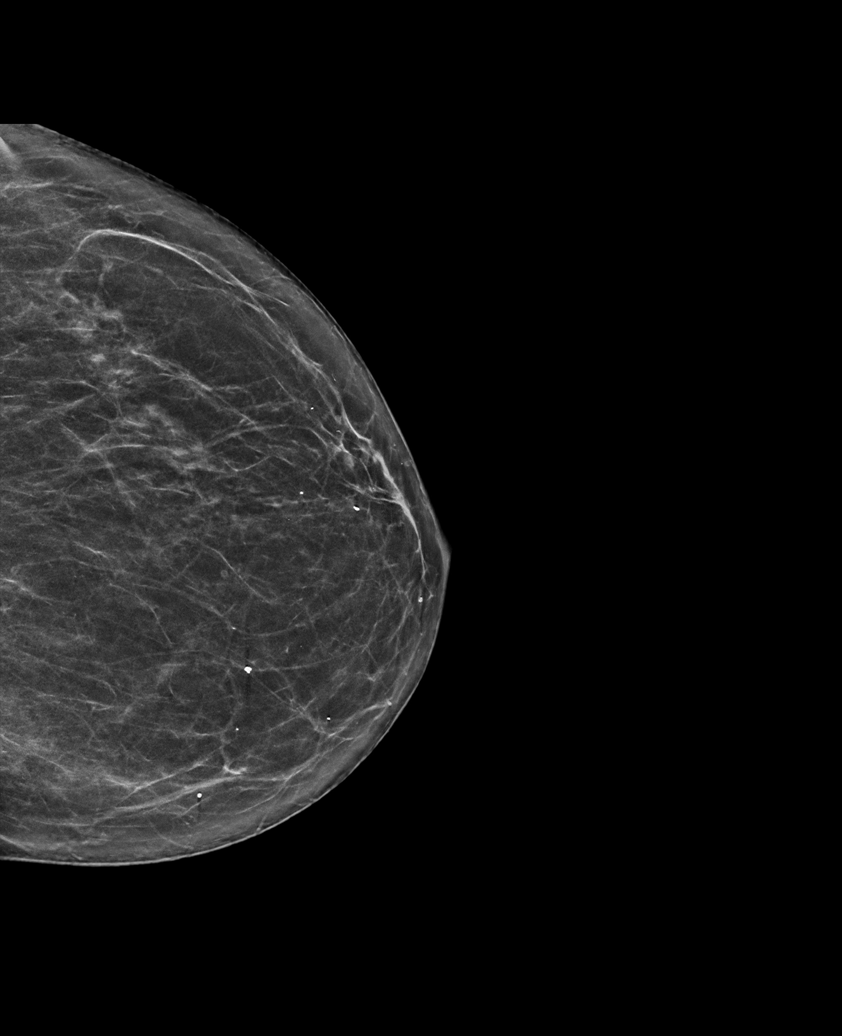

[L MLO synth-2D (1 of 2)]
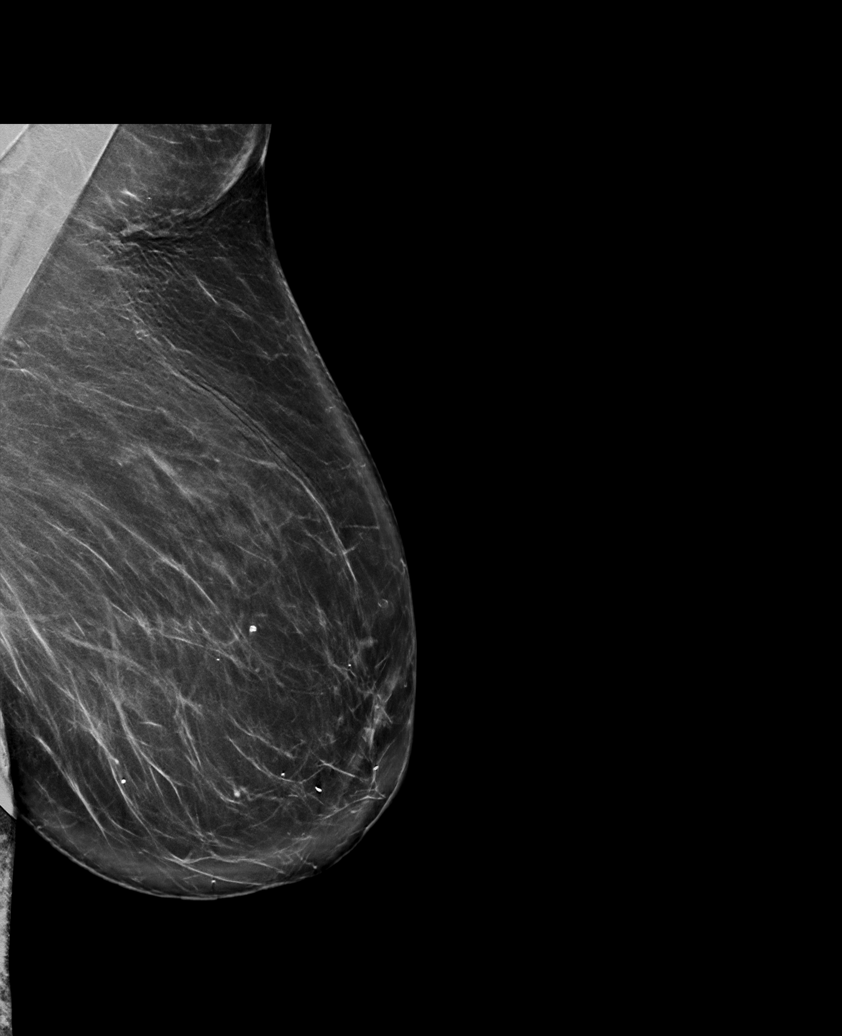

[L MLO synth-2D (2 of 2)]
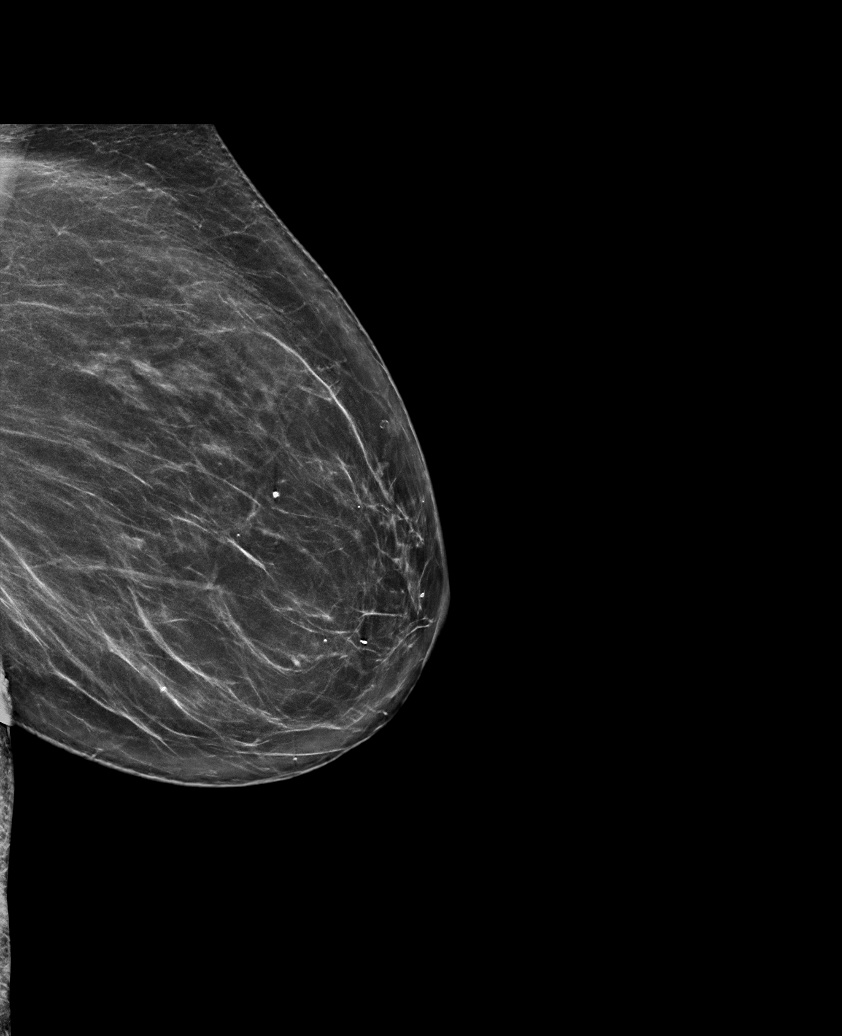

[R CC synth-2D]
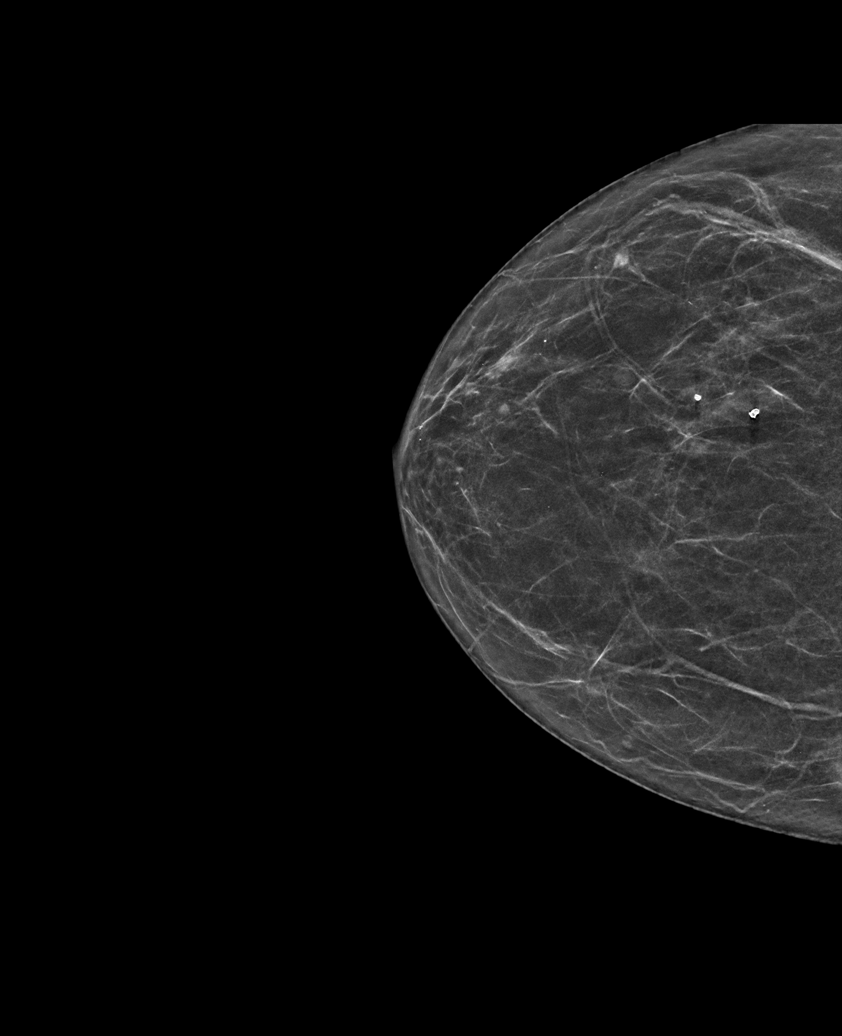

[R MLO synth-2D]
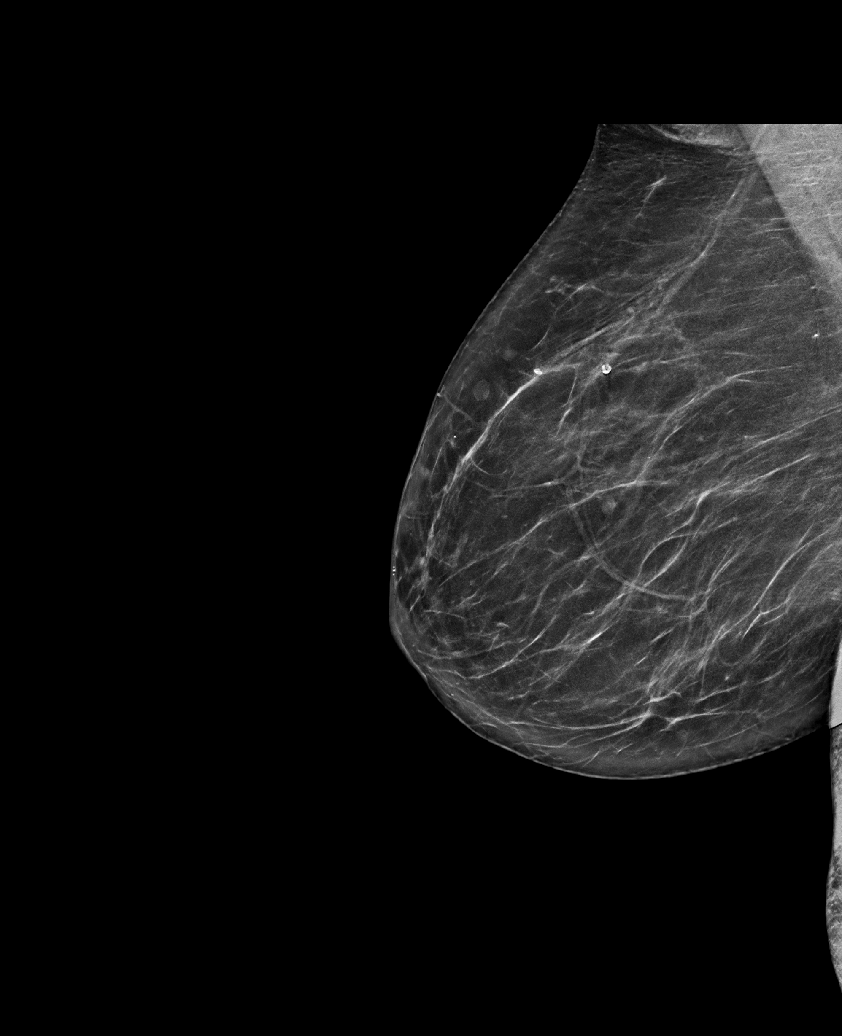

[L MLO tomo · tomo slice 43/86.0]
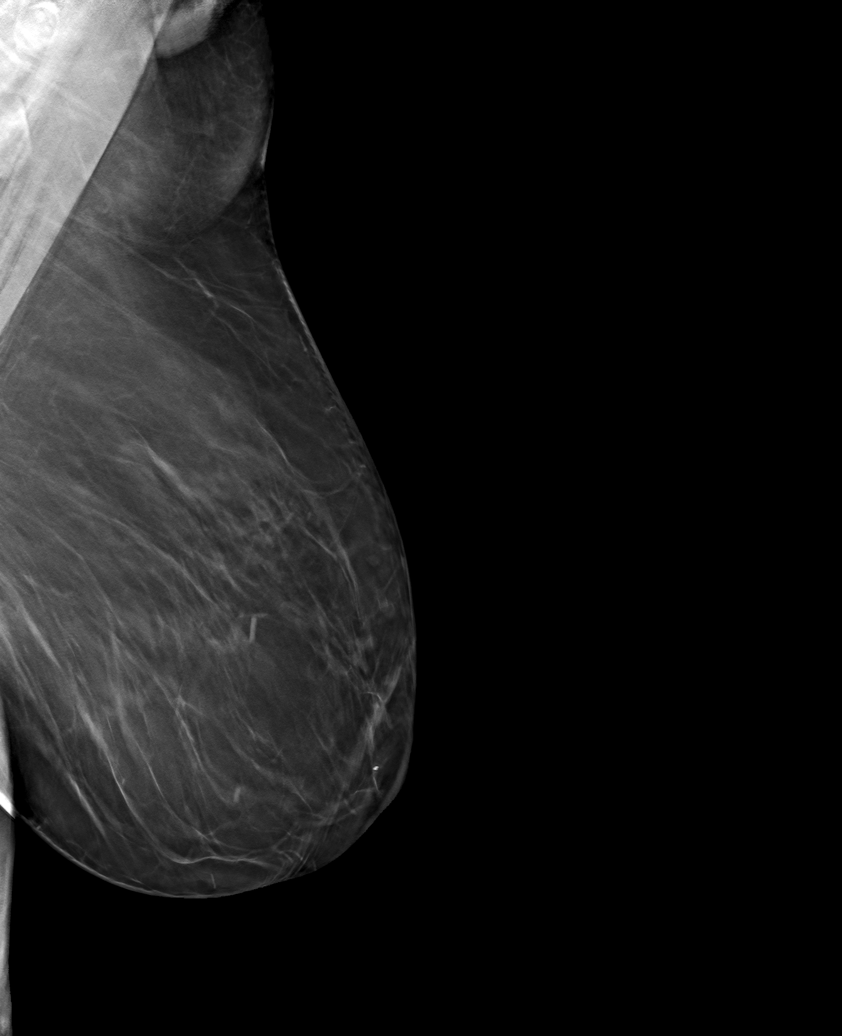

[6 of 30 positions shown; findings below may reference images not displayed]

ACR Breast Density Category b: There are scattered areas of
fibroglandular density.
FINDINGS: There are no findings suspicious for malignancy.
IMPRESSION: No mammographic evidence of malignancy. A result letter of this
screening mammogram will be mailed directly to the patient.

RECOMMENDATION:
Screening mammogram in one year. (Code:51-O-LD2)

BI-RADS CATEGORY  1: Negative.

## 2022-12-30 DIAGNOSIS — E7800 Pure hypercholesterolemia, unspecified: Secondary | ICD-10-CM | POA: Diagnosis not present

## 2022-12-30 DIAGNOSIS — R7303 Prediabetes: Secondary | ICD-10-CM | POA: Diagnosis not present

## 2022-12-30 DIAGNOSIS — I10 Essential (primary) hypertension: Secondary | ICD-10-CM | POA: Diagnosis not present

## 2023-01-29 ENCOUNTER — Other Ambulatory Visit: Payer: Self-pay

## 2023-01-29 ENCOUNTER — Emergency Department: Payer: HMO

## 2023-01-29 ENCOUNTER — Emergency Department
Admission: EM | Admit: 2023-01-29 | Discharge: 2023-01-29 | Disposition: A | Discharge status: Home / Self Care | Payer: HMO | Attending: Emergency Medicine | Admitting: Emergency Medicine

## 2023-01-29 ENCOUNTER — Encounter: Payer: Self-pay | Admitting: Medical Oncology

## 2023-01-29 DIAGNOSIS — S3992XA Unspecified injury of lower back, initial encounter: Secondary | ICD-10-CM | POA: Diagnosis not present

## 2023-01-29 DIAGNOSIS — I700 Atherosclerosis of aorta: Secondary | ICD-10-CM | POA: Diagnosis not present

## 2023-01-29 DIAGNOSIS — Y92410 Unspecified street and highway as the place of occurrence of the external cause: Secondary | ICD-10-CM | POA: Diagnosis not present

## 2023-01-29 DIAGNOSIS — S199XXA Unspecified injury of neck, initial encounter: Secondary | ICD-10-CM | POA: Diagnosis not present

## 2023-01-29 DIAGNOSIS — M545 Low back pain, unspecified: Secondary | ICD-10-CM | POA: Insufficient documentation

## 2023-01-29 DIAGNOSIS — I10 Essential (primary) hypertension: Secondary | ICD-10-CM | POA: Diagnosis not present

## 2023-01-29 DIAGNOSIS — M549 Dorsalgia, unspecified: Secondary | ICD-10-CM | POA: Diagnosis not present

## 2023-01-29 DIAGNOSIS — M47817 Spondylosis without myelopathy or radiculopathy, lumbosacral region: Secondary | ICD-10-CM | POA: Diagnosis not present

## 2023-01-29 DIAGNOSIS — M5459 Other low back pain: Secondary | ICD-10-CM | POA: Diagnosis not present
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
## 2023-06-08 DIAGNOSIS — M545 Low back pain, unspecified: Secondary | ICD-10-CM | POA: Diagnosis not present

## 2023-06-08 DIAGNOSIS — R002 Palpitations: Secondary | ICD-10-CM | POA: Diagnosis not present

## 2023-06-08 DIAGNOSIS — A499 Bacterial infection, unspecified: Secondary | ICD-10-CM | POA: Diagnosis not present

## 2023-06-08 DIAGNOSIS — N390 Urinary tract infection, site not specified: Secondary | ICD-10-CM | POA: Diagnosis not present

## 2023-06-08 DIAGNOSIS — G8929 Other chronic pain: Secondary | ICD-10-CM | POA: Diagnosis not present

## 2023-07-14 DIAGNOSIS — I10 Essential (primary) hypertension: Secondary | ICD-10-CM | POA: Diagnosis not present

## 2023-07-14 DIAGNOSIS — Z0000 Encounter for general adult medical examination without abnormal findings: Secondary | ICD-10-CM | POA: Diagnosis not present

## 2023-07-14 DIAGNOSIS — R7303 Prediabetes: Secondary | ICD-10-CM | POA: Diagnosis not present

## 2023-07-14 DIAGNOSIS — Z1211 Encounter for screening for malignant neoplasm of colon: Secondary | ICD-10-CM | POA: Diagnosis not present

## 2023-07-14 DIAGNOSIS — E7800 Pure hypercholesterolemia, unspecified: Secondary | ICD-10-CM | POA: Diagnosis not present

## 2023-10-08 DIAGNOSIS — M545 Low back pain, unspecified: Secondary | ICD-10-CM | POA: Diagnosis not present

## 2023-10-08 DIAGNOSIS — R350 Frequency of micturition: Secondary | ICD-10-CM | POA: Diagnosis not present

## 2023-10-08 DIAGNOSIS — N3941 Urge incontinence: Secondary | ICD-10-CM | POA: Diagnosis not present

## 2023-11-14 DIAGNOSIS — U071 COVID-19: Secondary | ICD-10-CM | POA: Diagnosis not present

## 2023-11-14 DIAGNOSIS — Z20822 Contact with and (suspected) exposure to covid-19: Secondary | ICD-10-CM | POA: Diagnosis not present

## 2023-11-14 DIAGNOSIS — J209 Acute bronchitis, unspecified: Secondary | ICD-10-CM | POA: Diagnosis not present

## 2023-12-15 DIAGNOSIS — R002 Palpitations: Secondary | ICD-10-CM | POA: Diagnosis not present

## 2023-12-15 DIAGNOSIS — I498 Other specified cardiac arrhythmias: Secondary | ICD-10-CM | POA: Diagnosis not present

## 2023-12-23 DIAGNOSIS — I498 Other specified cardiac arrhythmias: Secondary | ICD-10-CM | POA: Diagnosis not present

## 2024-01-08 DIAGNOSIS — Z860101 Personal history of adenomatous and serrated colon polyps: Secondary | ICD-10-CM | POA: Diagnosis not present

## 2024-01-08 DIAGNOSIS — Z599 Problem related to housing and economic circumstances, unspecified: Secondary | ICD-10-CM | POA: Diagnosis not present

## 2024-01-08 DIAGNOSIS — K5904 Chronic idiopathic constipation: Secondary | ICD-10-CM | POA: Diagnosis not present

## 2024-01-13 ENCOUNTER — Other Ambulatory Visit: Payer: Self-pay | Admitting: Internal Medicine

## 2024-01-13 DIAGNOSIS — Z1231 Encounter for screening mammogram for malignant neoplasm of breast: Secondary | ICD-10-CM | POA: Diagnosis not present

## 2024-01-13 DIAGNOSIS — R7303 Prediabetes: Secondary | ICD-10-CM | POA: Diagnosis not present

## 2024-01-13 DIAGNOSIS — E7800 Pure hypercholesterolemia, unspecified: Secondary | ICD-10-CM | POA: Diagnosis not present

## 2024-01-13 DIAGNOSIS — I10 Essential (primary) hypertension: Secondary | ICD-10-CM | POA: Diagnosis not present

## 2024-01-25 ENCOUNTER — Encounter
Admission: RE | Disposition: A | Discharge status: Home / Self Care | Payer: Self-pay | Source: Home / Self Care | Attending: Gastroenterology

## 2024-01-25 ENCOUNTER — Encounter: Payer: Self-pay | Admitting: Gastroenterology

## 2024-01-25 ENCOUNTER — Ambulatory Visit
Admission: RE | Admit: 2024-01-25 | Discharge: 2024-01-25 | Disposition: A | Discharge status: Home / Self Care | Attending: Gastroenterology | Admitting: Gastroenterology

## 2024-01-25 ENCOUNTER — Ambulatory Visit: Admitting: General Practice

## 2024-01-25 DIAGNOSIS — Z1211 Encounter for screening for malignant neoplasm of colon: Secondary | ICD-10-CM | POA: Diagnosis not present

## 2024-01-25 DIAGNOSIS — D122 Benign neoplasm of ascending colon: Secondary | ICD-10-CM | POA: Diagnosis not present

## 2024-01-25 DIAGNOSIS — K5730 Diverticulosis of large intestine without perforation or abscess without bleeding: Secondary | ICD-10-CM | POA: Insufficient documentation

## 2024-01-25 DIAGNOSIS — Z59869 Financial insecurity, unspecified: Secondary | ICD-10-CM | POA: Insufficient documentation

## 2024-01-25 DIAGNOSIS — Z860101 Personal history of adenomatous and serrated colon polyps: Secondary | ICD-10-CM | POA: Diagnosis not present

## 2024-01-25 DIAGNOSIS — Z5941 Food insecurity: Secondary | ICD-10-CM | POA: Insufficient documentation

## 2024-01-25 DIAGNOSIS — I10 Essential (primary) hypertension: Secondary | ICD-10-CM | POA: Insufficient documentation

## 2024-01-25 DIAGNOSIS — F419 Anxiety disorder, unspecified: Secondary | ICD-10-CM | POA: Diagnosis not present

## 2024-01-25 DIAGNOSIS — Z8249 Family history of ischemic heart disease and other diseases of the circulatory system: Secondary | ICD-10-CM | POA: Diagnosis not present

## 2024-01-25 DIAGNOSIS — K635 Polyp of colon: Secondary | ICD-10-CM | POA: Diagnosis not present

## 2024-01-25 HISTORY — PX: POLYPECTOMY: SHX149

## 2024-01-25 HISTORY — PX: COLONOSCOPY: SHX5424

## 2024-01-25 SURGERY — COLONOSCOPY
Anesthesia: General

## 2024-01-25 MED ORDER — PROPOFOL 10 MG/ML IV BOLUS
INTRAVENOUS | Status: DC | PRN
  Administered 2024-01-25: 70 mg via INTRAVENOUS

## 2024-01-25 MED ORDER — SODIUM CHLORIDE 0.9 % IV SOLN
INTRAVENOUS | Status: DC
  Administered 2024-01-25: 20 mL/h via INTRAVENOUS

## 2024-01-25 MED ORDER — PROPOFOL 500 MG/50ML IV EMUL
INTRAVENOUS | Status: DC | PRN
  Administered 2024-01-25: 150 ug/kg/min via INTRAVENOUS

## 2024-01-25 MED ORDER — LIDOCAINE HCL (CARDIAC) PF 100 MG/5ML IV SOSY
PREFILLED_SYRINGE | INTRAVENOUS | Status: DC | PRN
  Administered 2024-01-25: 50 mg via INTRAVENOUS

## 2024-01-25 NOTE — H&P (Signed)
 Ruel Kung , MD [address], [place], [place], [address] Phone: [phone] Fax: [phone]  Primary Care Physician:  Lenon Layman ORN, MD   Pre-Procedure History & Physical: HPI:  Claudia Barnes is a 73 y.o. female is here for an colonoscopy.   Past Medical History:  Diagnosis Date   Hypercholesteremia    Hypertension     Past Surgical History:  Procedure Laterality Date   APPENDECTOMY  1970   CESAREAN SECTION  1981   COLONOSCOPY WITH PROPOFOL  N/A 12/16/2018   Procedure: COLONOSCOPY WITH PROPOFOL ;  Surgeon: Kung Ruel, MD;  Location: [facility] ENDOSCOPY;  Service: Gastroenterology;  Laterality: N/A;    Prior to Admission medications   Medication Sig Start Date End Date Taking? Authorizing Provider  acyclovir (ZOVIRAX) 800 MG tablet Take 800 mg by mouth 5 (five) times daily.   Yes [provider]  Ascorbic Acid (VITAMIN C) 1000 MG tablet Take 1,000 mg by mouth daily.   Yes [provider]  CALCIUM PO Take by mouth daily.    Yes [provider]  Cholecalciferol 1000 UNITS capsule Take 2 capsules by mouth daily.   Yes [provider]  clobetasol  ointment (TEMOVATE ) 0.05 % Apply to affected area two to three nights a week as needed. 04/26/18  Yes Bacigalupo, Jon HERO, MD  hydrOXYzine  (ATARAX /VISTARIL ) 25 MG tablet Take 1 tablet (25 mg total) by mouth at bedtime. 03/26/17  Yes Schuman, Christanna R, MD  lovastatin  (MEVACOR ) 40 MG tablet Take 1 tablet (40 mg total) by mouth at bedtime. Please schedule office visit before any future refills. 11/21/19  Yes Bacigalupo, Angela M, MD  metoprolol  succinate (TOPROL -XL) 25 MG 24 hr tablet TAKE 1 TABLET BY MOUTH ONCE DAILY 03/22/19  Yes Chrismon, Marinda BRAVO, PA-C  Multiple Vitamin (MULTIVITAMIN) tablet Take 1 tablet by mouth daily.   Yes [provider]  Multiple Vitamins-Minerals (AIRBORNE GUMMIES PO) Take by mouth daily.   Yes [provider]  senna (SENOKOT)  8.6 MG tablet Take 1 tablet by mouth daily. As needed   Yes [provider]  traZODone  (DESYREL ) 50 MG tablet Take 50 mg by mouth at bedtime as needed for sleep.   Yes [provider]    Allergies as of 01/09/2024 - Review Complete 01/29/2023  Allergen Reaction Noted   Codeine Nausea Only 09/28/2014    Family History  Problem Relation Age of Onset   Congestive Heart Failure Father    Hyperlipidemia Sister    Hypertension Sister    Hyperlipidemia Sister    Hypertension Sister    COPD Mother    Depression Mother     Social History   Socioeconomic History   Marital status: Divorced    Spouse name: Not on file   Number of children: 1   Years of education: Not on file   Highest education level: GED or equivalent  Occupational History   Occupation: retired  Tobacco Use   Smoking status: Never   Smokeless tobacco: Never  Vaping Use   Vaping status: Never Used  Substance and Sexual Activity   Alcohol use: Yes    Alcohol/week: 0.0 - 1.0 standard drinks of alcohol   Drug use: No   Sexual activity: Not on file  Other Topics Concern   Not on file  Social History Narrative   Not on file   Social Drivers of Health   Financial Resource Strain: High Risk (01/08/2024)   Received from [facility]  Overall Financial Resource Strain (CARDIA)    Difficulty of Paying Living Expenses: Hard  Food Insecurity: Food Insecurity Present (01/08/2024)   Received from [facility]   Hunger Vital Sign    Within the past 12 months, you worried that your food would run out before you got the money to buy more.: Often true    Within the past 12 months, the food you bought just didn't last and you didn't have money to get more.: Often true  Transportation Needs: No Transportation Needs (01/08/2024)   Received from [facility] - Transportation    In the past 12 months, has lack of transportation kept you from  medical appointments or from getting medications?: No    Lack of Transportation (Non-Medical): No  Physical Activity: Inactive (06/23/2019)   Exercise Vital Sign    Days of Exercise per Week: 0 days    Minutes of Exercise per Session: 0 min  Stress: Stress Concern Present (06/23/2019)   [employer] - Occupational Stress Questionnaire    Feeling of Stress : To some extent  Social Connections: Socially Isolated (06/23/2019)   Social Connection and Isolation Panel    Frequency of Communication with Friends and Family: Once a week    Frequency of Social Gatherings with Friends and Family: More than three times a week    Attends Religious Services: Never    Database Administrator or Organizations: No    Attends Banker Meetings: Never    Marital Status: Divorced  Catering Manager Violence: Not At Risk (06/23/2019)   Humiliation, Afraid, Rape, and Kick questionnaire    Fear of Current or Ex-Partner: No    Emotionally Abused: No    Physically Abused: No    Sexually Abused: No    Review of Systems: See HPI, otherwise negative ROS  Physical Exam: BP 133/70   Pulse (!) 101   Temp (!) 97.3 F (36.3 C) (Temporal)   Resp 20   Ht 5' (1.524 m)   Wt 60.9 kg   SpO2 100%   BMI 26.21 kg/m  General:   Alert,  pleasant and cooperative in NAD Head:  Normocephalic and atraumatic. Neck:  Supple; no masses or thyromegaly. Lungs:  Clear throughout to auscultation, normal respiratory effort.    Heart:  +S1, +S2, Regular rate and rhythm, No edema. Abdomen:  Soft, nontender and nondistended. Normal bowel sounds, without guarding, and without rebound.   Neurologic:  Alert and  oriented x4;  grossly normal neurologically.  Impression/Plan: Randi Poullard is here for an colonoscopy to be performed for surveillance due to prior history of colon polyps   Risks, benefits, limitations, and alternatives regarding  colonoscopy have been reviewed with the  patient.  Questions have been answered.  All parties agreeable.   Ruel Kung, MD  01/25/2024, 10:28 AM

## 2024-01-25 NOTE — Op Note (Signed)
 [facility] Gastroenterology Patient Name: Claudia Barnes Procedure Date: 01/25/2024 11:08 AM MRN: [id number] Account #: [id number] Date of Birth: 1951/01/13 Admit Type: Outpatient Age: 73 Room: [facility] ENDO ROOM 1 Gender: Female Note Status: Finalized Instrument Name: Colon Scope [id number] Procedure:             Colonoscopy Indications:           Surveillance: Personal history of adenomatous polyps                         on last colonoscopy > 3 years ago, Last colonoscopy:                         September 2020 Providers:             Ruel Kung MD, MD Referring MD:          Layman ORN. Lenon MD, MD (Referring MD) Medicines:             Monitored Anesthesia Care Complications:         No immediate complications. Procedure:             Pre-Anesthesia Assessment:                        - Prior to the procedure, a History and Physical was                         performed, and patient medications, allergies and                         sensitivities were reviewed. The patient's tolerance                         of previous anesthesia was reviewed.                        - The risks and benefits of the procedure and the                         sedation options and risks were discussed with the                         patient. All questions were answered and informed                         consent was obtained.                        - ASA Grade Assessment: II - A patient with mild                         systemic disease.                        After obtaining informed consent, the colonoscope was                         passed under direct vision. Throughout the procedure,  the patient's blood pressure, pulse, and oxygen                         saturations were monitored continuously. The                         Colonoscope was introduced through the anus and                         advanced to the the cecum, identified by the                          appendiceal orifice. The colonoscopy was performed                         with ease. The patient tolerated the procedure well.                         The quality of the bowel preparation was excellent.                         The ileocecal valve, appendiceal orifice, and rectum                         were photographed. Findings:      The perianal and digital rectal examinations were normal.      Two sessile polyps were found in the descending colon and ascending       colon. The polyps were 4 to 5 mm in size. These polyps were removed with       a cold snare. Resection and retrieval were complete.      Multiple medium-mouthed diverticula were found in the left colon.      The exam was otherwise without abnormality on direct and retroflexion       views. Impression:            - Two 4 to 5 mm polyps in the descending colon and in                         the ascending colon, removed with a cold snare.                         Resected and retrieved.                        - Diverticulosis in the left colon.                        - The examination was otherwise normal on direct and                         retroflexion views. Recommendation:        - Discharge patient to home (with escort).                        - Resume previous diet.                        - Continue present medications.                        -  Await pathology results.                        - Repeat colonoscopy is not recommended due to current                         age (34 years or older) for surveillance. Procedure Code(s):     --- Professional ---                        [id number], Colonoscopy, flexible; with removal of                         tumor(s), polyp(s), or other lesion(s) by snare                         technique Diagnosis Code(s):     --- Professional ---                        Z86.010, Personal history of colonic polyps                        D12.4, Benign neoplasm of descending colon                         D12.2, Benign neoplasm of ascending colon                        K57.30, Diverticulosis of large intestine without                         perforation or abscess without bleeding CPT copyright 2022 American Medical Association. All rights reserved. The codes documented in this report are preliminary and upon coder review may  be revised to meet current compliance requirements. Ruel Kung, MD Ruel Kung MD, MD 01/25/2024 11:40:07 AM This report has been signed electronically. Number of Addenda: 0 Note Initiated On: 01/25/2024 11:08 AM Scope Withdrawal Time: 0 hours 15 minutes 6 seconds  Total Procedure Duration: 0 hours 20 minutes 14 seconds  Estimated Blood Loss:  Estimated blood loss: none.      [facility]

## 2024-01-25 NOTE — Transfer of Care (Signed)
 Immediate Anesthesia Transfer of Care Note  Patient: Claudia Barnes  Procedure(s) Performed: COLONOSCOPY POLYPECTOMY, INTESTINE  Patient Location: Endoscopy Unit  Anesthesia Type:General  Level of Consciousness: drowsy and patient cooperative  Airway & Oxygen Therapy: Patient Spontanous Breathing  Post-op Assessment: Report given to RN, Post -op Vital signs reviewed and stable, and Patient moving all extremities X 4  Post vital signs: Reviewed and stable  Last Vitals:  Vitals Value Taken Time  BP    Temp    Pulse 92 01/25/24 11:42  Resp 14 01/25/24 11:42  SpO2 97 % 01/25/24 11:42  Vitals shown include unfiled device data.  Last Pain:  Vitals:   01/25/24 0956  TempSrc: Temporal  PainSc: 0-No pain         Complications: No notable events documented.

## 2024-01-25 NOTE — Anesthesia Postprocedure Evaluation (Signed)
 Anesthesia Post Note  Patient: Claudia Barnes  Procedure(s) Performed: COLONOSCOPY POLYPECTOMY, INTESTINE  Patient location during evaluation: Endoscopy Anesthesia Type: General Level of consciousness: awake and alert Pain management: pain level controlled Vital Signs Assessment: post-procedure vital signs reviewed and stable Respiratory status: spontaneous breathing, nonlabored ventilation, respiratory function stable and patient connected to nasal cannula oxygen Cardiovascular status: blood pressure returned to baseline and stable Postop Assessment: no apparent nausea or vomiting Anesthetic complications: no   No notable events documented.   Last Vitals:  Vitals:   01/25/24 1150 01/25/24 1201  BP: 120/61 119/64  Pulse: 92 90  Resp: (!) 22 17  Temp:    SpO2: 96% 99%    Last Pain:  Vitals:   01/25/24 1201  TempSrc:   PainSc: 0-No pain                 Debby M[place]
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
## 2024-01-26 DIAGNOSIS — R053 Chronic cough: Secondary | ICD-10-CM | POA: Diagnosis not present

## 2024-01-26 LAB — SURGICAL PATHOLOGY

## 2024-02-03 DIAGNOSIS — J986 Disorders of diaphragm: Secondary | ICD-10-CM | POA: Diagnosis not present

## 2024-02-03 DIAGNOSIS — R053 Chronic cough: Secondary | ICD-10-CM | POA: Diagnosis not present

## 2024-02-03 DIAGNOSIS — R918 Other nonspecific abnormal finding of lung field: Secondary | ICD-10-CM | POA: Diagnosis not present

## 2024-02-24 ENCOUNTER — Ambulatory Visit
Admission: RE | Admit: 2024-02-24 | Discharge: 2024-02-24 | Disposition: A | Discharge status: Home / Self Care | Source: Ambulatory Visit | Attending: Internal Medicine | Admitting: Internal Medicine

## 2024-02-24 DIAGNOSIS — Z1231 Encounter for screening mammogram for malignant neoplasm of breast: Secondary | ICD-10-CM | POA: Insufficient documentation

## 2024-03-01 DIAGNOSIS — R053 Chronic cough: Secondary | ICD-10-CM | POA: Diagnosis not present

## 2024-03-01 DIAGNOSIS — R918 Other nonspecific abnormal finding of lung field: Secondary | ICD-10-CM | POA: Diagnosis not present

## 2024-03-03 ENCOUNTER — Other Ambulatory Visit: Payer: Self-pay | Admitting: Specialist

## 2024-03-03 DIAGNOSIS — R918 Other nonspecific abnormal finding of lung field: Secondary | ICD-10-CM

## 2024-03-03 DIAGNOSIS — I10 Essential (primary) hypertension: Secondary | ICD-10-CM | POA: Diagnosis not present

## 2024-03-03 DIAGNOSIS — R053 Chronic cough: Secondary | ICD-10-CM

## 2024-03-03 DIAGNOSIS — Z23 Encounter for immunization: Secondary | ICD-10-CM | POA: Diagnosis not present

## 2024-03-03 DIAGNOSIS — R7303 Prediabetes: Secondary | ICD-10-CM | POA: Diagnosis not present

## 2024-03-08 ENCOUNTER — Ambulatory Visit: Admission: RE | Admit: 2024-03-08 | Discharge: 2024-03-08 | Attending: Specialist | Admitting: Specialist

## 2024-03-08 DIAGNOSIS — R918 Other nonspecific abnormal finding of lung field: Secondary | ICD-10-CM | POA: Diagnosis not present

## 2024-03-08 DIAGNOSIS — R053 Chronic cough: Secondary | ICD-10-CM | POA: Diagnosis not present
